# Patient Record
Sex: Female | Born: 1990 | Race: White | Hispanic: No | Marital: Married | Smoking: Never smoker
Health system: Southern US, Community
[De-identification: ages and names within clinical notes are randomized; demographics above are authoritative.]

## PROBLEM LIST (undated history)

## (undated) ENCOUNTER — Inpatient Hospital Stay: Payer: Self-pay

## (undated) ENCOUNTER — Inpatient Hospital Stay (HOSPITAL_COMMUNITY): Payer: Self-pay

## (undated) DIAGNOSIS — G43909 Migraine, unspecified, not intractable, without status migrainosus: Secondary | ICD-10-CM

## (undated) DIAGNOSIS — F32A Depression, unspecified: Secondary | ICD-10-CM

## (undated) DIAGNOSIS — F419 Anxiety disorder, unspecified: Secondary | ICD-10-CM

## (undated) DIAGNOSIS — F329 Major depressive disorder, single episode, unspecified: Secondary | ICD-10-CM

## (undated) HISTORY — PX: WISDOM TOOTH EXTRACTION: SHX21

## (undated) HISTORY — PX: APPENDECTOMY: SHX54

## (undated) HISTORY — PX: OTHER SURGICAL HISTORY: SHX169

## (undated) HISTORY — DX: Major depressive disorder, single episode, unspecified: F32.9

## (undated) HISTORY — DX: Depression, unspecified: F32.A

## (undated) HISTORY — DX: Migraine, unspecified, not intractable, without status migrainosus: G43.909

## (undated) HISTORY — DX: Anxiety disorder, unspecified: F41.9

---

## 2017-11-29 DIAGNOSIS — F419 Anxiety disorder, unspecified: Secondary | ICD-10-CM | POA: Insufficient documentation

## 2018-01-30 ENCOUNTER — Encounter: Payer: Self-pay | Admitting: *Deleted

## 2018-02-01 ENCOUNTER — Encounter: Payer: Self-pay | Admitting: Family Medicine

## 2018-02-01 ENCOUNTER — Ambulatory Visit (INDEPENDENT_AMBULATORY_CARE_PROVIDER_SITE_OTHER): Admitting: Family Medicine

## 2018-02-01 ENCOUNTER — Other Ambulatory Visit: Payer: Self-pay

## 2018-02-01 VITALS — BP 104/62 | HR 72 | Temp 98.2°F | Resp 14 | Ht 60.63 in | Wt 97.5 lb

## 2018-02-01 DIAGNOSIS — Z7689 Persons encountering health services in other specified circumstances: Secondary | ICD-10-CM | POA: Diagnosis not present

## 2018-02-01 DIAGNOSIS — R1032 Left lower quadrant pain: Secondary | ICD-10-CM | POA: Diagnosis not present

## 2018-02-01 DIAGNOSIS — Z3201 Encounter for pregnancy test, result positive: Secondary | ICD-10-CM | POA: Diagnosis not present

## 2018-02-01 DIAGNOSIS — Z3490 Encounter for supervision of normal pregnancy, unspecified, unspecified trimester: Secondary | ICD-10-CM

## 2018-02-01 DIAGNOSIS — Z3200 Encounter for pregnancy test, result unknown: Secondary | ICD-10-CM | POA: Diagnosis not present

## 2018-02-01 DIAGNOSIS — Z113 Encounter for screening for infections with a predominantly sexual mode of transmission: Secondary | ICD-10-CM

## 2018-02-01 LAB — URINALYSIS, ROUTINE W REFLEX MICROSCOPIC
BILIRUBIN URINE: NEGATIVE
Bacteria, UA: NONE SEEN /HPF
GLUCOSE, UA: NEGATIVE
KETONES UR: NEGATIVE
Leukocytes, UA: NEGATIVE
NITRITE: NEGATIVE
PH: 7 (ref 5.0–8.0)
Protein, ur: NEGATIVE
SPECIFIC GRAVITY, URINE: 1.01 (ref 1.001–1.03)
WBC, UA: NONE SEEN /HPF (ref 0–5)

## 2018-02-01 LAB — MICROSCOPIC MESSAGE

## 2018-02-01 LAB — PREGNANCY, URINE: PREG TEST UR: POSITIVE — AB

## 2018-02-01 NOTE — Patient Instructions (Addendum)
First Trimester of Pregnancy The first trimester of pregnancy is from week 1 until the end of week 13 (months 1 through 3). During this time, your baby will begin to develop inside you. At 6-8 weeks, the eyes and face are formed, and the heartbeat can be seen on ultrasound. At the end of 12 weeks, all the baby's organs are formed. Prenatal care is all the medical care you receive before the birth of your baby. Make sure you get good prenatal care and follow all of your doctor's instructions. Follow these instructions at home: Medicines  Take over-the-counter and prescription medicines only as told by your doctor. Some medicines are safe and some medicines are not safe during pregnancy.  Take a prenatal vitamin that contains at least 600 micrograms (mcg) of folic acid.  If you have trouble pooping (constipation), take medicine that will make your stool soft (stool softener) if your doctor approves. Eating and drinking  Eat regular, healthy meals.  Your doctor will tell you the amount of weight gain that is right for you.  Avoid raw meat and uncooked cheese.  If you feel sick to your stomach (nauseous) or throw up (vomit): ? Eat 4 or 5 small meals a day instead of 3 large meals. ? Try eating a few soda crackers. ? Drink liquids between meals instead of during meals.  To prevent constipation: ? Eat foods that are high in fiber, like fresh fruits and vegetables, whole grains, and beans. ? Drink enough fluids to keep your pee (urine) clear or pale yellow. Activity  Exercise only as told by your doctor. Stop exercising if you have cramps or pain in your lower belly (abdomen) or low back.  Do not exercise if it is too hot, too humid, or if you are in a place of great height (high altitude).  Try to avoid standing for long periods of time. Move your legs often if you must stand in one place for a long time.  Avoid heavy lifting.  Wear low-heeled shoes. Sit and stand up straight.  You  can have sex unless your doctor tells you not to. Relieving pain and discomfort  Wear a good support bra if your breasts are sore.  Take warm water baths (sitz baths) to soothe pain or discomfort caused by hemorrhoids. Use hemorrhoid cream if your doctor says it is okay.  Rest with your legs raised if you have leg cramps or low back pain.  If you have puffy, bulging veins (varicose veins) in your legs: ? Wear support hose or compression stockings as told by your doctor. ? Raise (elevate) your feet for 15 minutes, 3-4 times a day. ? Limit salt in your food. Prenatal care  Schedule your prenatal visits by the twelfth week of pregnancy.  Write down your questions. Take them to your prenatal visits.  Keep all your prenatal visits as told by your doctor. This is important. Safety  Wear your seat belt at all times when driving.  Make a list of emergency phone numbers. The list should include numbers for family, friends, the hospital, and police and fire departments. General instructions  Ask your doctor for a referral to a local prenatal class. Begin classes no later than at the start of month 6 of your pregnancy.  Ask for help if you need counseling or if you need help with nutrition. Your doctor can give you advice or tell you where to go for help.  Do not use hot tubs, steam rooms, or   saunas.  Do not douche or use tampons or scented sanitary pads.  Do not cross your legs for long periods of time.  Avoid all herbs and alcohol. Avoid drugs that are not approved by your doctor.  Do not use any tobacco products, including cigarettes, chewing tobacco, and electronic cigarettes. If you need help quitting, ask your doctor. You may get counseling or other support to help you quit.  Avoid cat litter boxes and soil used by cats. These carry germs that can cause birth defects in the baby and can cause a loss of your baby (miscarriage) or stillbirth.  Visit your dentist. At home, brush  your teeth with a soft toothbrush. Be gentle when you floss. Contact a doctor if:  You are dizzy.  You have mild cramps or pressure in your lower belly.  You have a nagging pain in your belly area.  You continue to feel sick to your stomach, you throw up, or you have watery poop (diarrhea).  You have a bad smelling fluid coming from your vagina.  You have pain when you pee (urinate).  You have increased puffiness (swelling) in your face, hands, legs, or ankles. Get help right away if:  You have a fever.  You are leaking fluid from your vagina.  You have spotting or bleeding from your vagina.  You have very bad belly cramping or pain.  You gain or lose weight rapidly.  You throw up blood. It may look like coffee grounds.  You are around people who have German measles, fifth disease, or chickenpox.  You have a very bad headache.  You have shortness of breath.  You have any kind of trauma, such as from a fall or a car accident. Summary  The first trimester of pregnancy is from week 1 until the end of week 13 (months 1 through 3).  To take care of yourself and your unborn baby, you will need to eat healthy meals, take medicines only if your doctor tells you to do so, and do activities that are safe for you and your baby.  Keep all follow-up visits as told by your doctor. This is important as your doctor will have to ensure that your baby is healthy and growing well. This information is not intended to replace advice given to you by your health care provider. Make sure you discuss any questions you have with your health care provider. Document Released: 10/27/2007 Document Revised: 05/18/2016 Document Reviewed: 05/18/2016 Elsevier Interactive Patient Education  2017 [employer]vier Inc.  

## 2018-02-01 NOTE — Progress Notes (Signed)
Patient ID: Vickie Manning, female    DOB: 10-11-1990, 27 y.o.   MRN: [id number]  PCP: Delsa Grana, PA-C  Chief Complaint  Patient presents with  . New Patient    is fasting  . Pregnancy Test    confirmation of home pregnancy test    Subjective:   Vickie Manning is a 27 y.o. female, presents to clinic with CC of suspected pregnancy from + home pregnancy tests.  She has been trying to conceive with her husband for almost a year.  Is here for confirmation of pregnancy and would need OBGYN referral. Notes last LMP 12/10/17, per her norm.  No spotting since.  She has been nauseaous a few times in the past month, no vomiting.  She is low weight ever since a surgery when she was in high school, but since July has maintained her weight.   She is G99P0, married, one female partner for several years.  He has hx of genital herpes, she has tested negative and they are careful to avoid coitus when he has active lesions.  She has not had any past concerns for or + STD's.  Last PAP normal Nov or Dec last year.  No past OBGYN issues.  Sister has hx of endometriosis.  Mother had hysterectomy in her 58's due to bladder prolapse.  She is also here to establish care.    PMHx reviewed includes mild anxiety, not currently treated with medication.  Also Hx of appendectomy 9th grade in 2007, was ruptured, she notes complications with surgery and recovery with "dead guts" she was in hospital and on liquids for several months - Hospitalization in [place].  She started to have     Order [place]  PAP last Nov/Dec - done in [facility] [place] records all on base and for anxiety They did screening for STD's,   She does report mild to moderate intermittent LLQ pain that feels sharp, lasts for only a few seconds at a time, onset ~ 1 week ago.  Bowels normal, denies urinary and vag sx. No back pain.  No vaginal spotting.  09/09/2018   There are no active problems  to display for this patient.   Prior to Admission medications   Medication Sig Start Date End Date Taking? Authorizing Provider  Prenatal Vit-Fe Fumarate-FA (PRENATAL MULTIVITAMIN) TABS tablet Take 2 tablets by mouth daily at 12 noon.   Yes [provider]     No Known Allergies   History reviewed. No pertinent family history.   Social History   Socioeconomic History  . Marital status: Married    Spouse name: Not on file  . Number of children: Not on file  . Years of education: Not on file  . Highest education level: Some college, no degree  Occupational History  . Not on file  Social Needs  . Financial resource strain: Not hard at all  . Food insecurity:    Worry: Never true    Inability: Never true  . Transportation needs:    Medical: No    Non-medical: No  Tobacco Use  . Smoking status: Never Smoker  . Smokeless tobacco: Never Used  Substance and Sexual Activity  . Alcohol use: Never    Frequency: Never  . Drug use: Never  . Sexual activity: Yes  Lifestyle  . Physical activity:    Days per week: 0 days    Minutes per session: 0 min  . Stress: To some  extent  Relationships  . Social connections:    Talks on phone: More than three times a week    Gets together: Once a week    Attends religious service: More than 4 times per year    Active member of club or organization: No    Attends meetings of clubs or organizations: Never    Relationship status: Married  . Intimate partner violence:    Fear of current or ex partner: No    Emotionally abused: No    Physically abused: No    Forced sexual activity: No  Other Topics Concern  . Not on file  Social History Narrative  . Not on file     Review of Systems  Constitutional: Negative.  Negative for activity change, appetite change, chills, diaphoresis, fatigue and fever.  HENT: Negative.   Eyes: Negative.   Respiratory: Negative.   Cardiovascular: Negative.   Gastrointestinal: Positive for  nausea. Negative for abdominal distention, anal bleeding, blood in stool, diarrhea, rectal pain and vomiting.  Endocrine: Negative.   Genitourinary: Positive for pelvic pain. Negative for decreased urine volume, difficulty urinating, dyspareunia, dysuria, enuresis, flank pain, frequency, genital sores, hematuria, menstrual problem, urgency, vaginal bleeding, vaginal discharge and vaginal pain.  Musculoskeletal: Negative.   Skin: Negative.   Allergic/Immunologic: Negative.  Negative for immunocompromised state.  Neurological: Negative.  Negative for syncope and weakness.  Hematological: Negative.   Psychiatric/Behavioral: Negative.   All other systems reviewed and are negative.      Objective:    Vitals:   02/01/18 0912  BP: 104/62  Pulse: 72  Resp: 14  Temp: 98.2 F (36.8 C)  TempSrc: Oral  SpO2: 98%  Weight: 97 lb 8 oz (44.2 kg)  Height: 5' 0.63" (1.54 m)      Physical Exam  Constitutional: She is oriented to person, place, and time. Vital signs are normal. She appears well-developed.  Non-toxic appearance. She does not have a sickly appearance. She does not appear ill. No distress.  Very thin young female, mildly anxious appearing, no distress  HENT:  Head: Normocephalic and atraumatic.  Right Ear: External ear normal.  Left Ear: External ear normal.  Nose: Nose normal.  Mouth/Throat: Uvula is midline, oropharynx is clear and moist and mucous membranes are normal.  Eyes: Pupils are equal, round, and reactive to light. Conjunctivae, EOM and lids are normal.  Neck: Normal range of motion and phonation normal. Neck supple. No tracheal deviation present.  Cardiovascular: Normal rate, regular rhythm, normal heart sounds and normal pulses. Exam reveals no gallop and no friction rub.  No murmur heard. Pulses:      Radial pulses are 2+ on the right side, and 2+ on the left side.       Posterior tibial pulses are 2+ on the right side, and 2+ on the left side.  Pulmonary/Chest:  Effort normal and breath sounds normal. No stridor. No respiratory distress. She has no wheezes. She has no rhonchi. She has no rales. She exhibits no tenderness.  Abdominal: Soft. Normal appearance and bowel sounds are normal. She exhibits no distension. There is tenderness in the left lower quadrant. There is no rigidity, no rebound, no guarding, no CVA tenderness, no tenderness at McBurney's point and negative Murphy's sign.  Musculoskeletal: Normal range of motion. She exhibits no edema or deformity.  Lymphadenopathy:    She has no cervical adenopathy.  Neurological: She is alert and oriented to person, place, and time. She exhibits normal muscle tone. Gait normal.  Skin: Skin is warm, dry and intact. Capillary refill takes less than 2 seconds. No rash noted. She is not diaphoretic. No pallor.  Psychiatric: She has a normal mood and affect. Her speech is normal and behavior is normal.  Nursing note and vitals reviewed.    Results for orders placed or performed in visit on 02/01/18  Pregnancy, urine  Result Value Ref Range   Preg Test, Ur POSITIVE (A) NEGATIVE  Urinalysis, Routine w reflex microscopic  Result Value Ref Range   Color, Urine YELLOW YELLOW   APPearance CLEAR CLEAR   Specific Gravity, Urine 1.010 1.001 - 1.03   pH 7.0 5.0 - 8.0   Glucose, UA NEGATIVE NEGATIVE   Bilirubin Urine NEGATIVE NEGATIVE   Ketones, ur NEGATIVE NEGATIVE   Hgb urine dipstick TRACE (A) NEGATIVE   Protein, ur NEGATIVE NEGATIVE   Nitrite NEGATIVE NEGATIVE   Leukocytes, UA NEGATIVE NEGATIVE   WBC, UA NONE SEEN 0 - 5 /HPF   RBC / HPF 0-2 0 - 2 /HPF   Squamous Epithelial / LPF 0-5 < OR = 5 /HPF   Bacteria, UA NONE SEEN NONE SEEN /HPF  Microscopic Message  Result Value Ref Range   Note         Assessment & Plan:      ICD-10-CM   1. Left lower quadrant pain R10.32 US PELVIC COMPLETE WITH TRANSVAGINAL  2. Pregnancy, unspecified gestational age Z29.90 Urinalysis, Routine w reflex microscopic     Urine Culture    Ambulatory referral to Obstetrics / Gynecology    US PELVIC COMPLETE WITH TRANSVAGINAL  3. Positive urine pregnancy test Z32.01 Ambulatory referral to Obstetrics / Gynecology    US PELVIC COMPLETE WITH TRANSVAGINAL  4. Encounter for confirmation of pregnancy test result with physical examination Z32.00 Pregnancy, urine    Ambulatory referral to Obstetrics / Gynecology  5. Encounter to establish care with new doctor Z76.89 CBC with Differential/Platelet    COMPLETE METABOLIC PANEL WITH GFR  6. Screening for STD (sexually transmitted disease) Z11.3 RPR    HIV antibody    24825 for the transvaginal and 00370 - order changed per radiology EDD 09/08/17   + preg test, per LMP pt estimated 7w 4d, mild LLQ ttp on exam, no rebound tenderness, no guarding, she does have some hx of constipation/IBS, will get pelvis US to confirm IUP and r/o ectopic.  No vag bleeding.    Referral to OBGYN done.  Pt on prenatals.  Discussed importance of maintaining weight to best ability, avoid NSAIDs, 1st trimester pregnancy info printed for pt.    Other pt med records will be requested. Blood STD's done today - refer PAP, pelvic and other STD testing for initial OBGYN visit for routine pregnancy   Delsa Grana, PA-C 02/01/18 9:24 AM

## 2018-02-02 LAB — CBC WITH DIFFERENTIAL/PLATELET
Basophils Absolute: 38 cells/uL (ref 0–200)
Basophils Relative: 0.6 %
EOS ABS: 13 {cells}/uL — AB (ref 15–500)
Eosinophils Relative: 0.2 %
HCT: 42.6 % (ref 35.0–45.0)
HEMOGLOBIN: 14.6 g/dL (ref 11.7–15.5)
Lymphs Abs: 1517 cells/uL (ref 850–3900)
MCH: 31.1 pg (ref 27.0–33.0)
MCHC: 34.3 g/dL (ref 32.0–36.0)
MCV: 90.8 fL (ref 80.0–100.0)
MPV: 9.5 fL (ref 7.5–12.5)
Monocytes Relative: 8.3 %
NEUTROS ABS: 4301 {cells}/uL (ref 1500–7800)
Neutrophils Relative %: 67.2 %
PLATELETS: 234 10*3/uL (ref 140–400)
RBC: 4.69 10*6/uL (ref 3.80–5.10)
RDW: 11.7 % (ref 11.0–15.0)
TOTAL LYMPHOCYTE: 23.7 %
WBC: 6.4 10*3/uL (ref 3.8–10.8)
WBCMIX: 531 {cells}/uL (ref 200–950)

## 2018-02-02 LAB — COMPLETE METABOLIC PANEL WITH GFR
AG RATIO: 2 (calc) (ref 1.0–2.5)
ALBUMIN MSPROF: 4.7 g/dL (ref 3.6–5.1)
ALT: 9 U/L (ref 6–29)
AST: 15 U/L (ref 10–30)
Alkaline phosphatase (APISO): 41 U/L (ref 33–115)
BUN/Creatinine Ratio: 9 (calc) (ref 6–22)
BUN: 6 mg/dL — AB (ref 7–25)
CALCIUM: 10.1 mg/dL (ref 8.6–10.2)
CHLORIDE: 102 mmol/L (ref 98–110)
CO2: 26 mmol/L (ref 20–32)
CREATININE: 0.69 mg/dL (ref 0.50–1.10)
GFR, Est African American: 139 mL/min/{1.73_m2} (ref >=60)
GFR, Est Non African American: 120 mL/min/{1.73_m2} (ref >=60)
GLUCOSE: 81 mg/dL (ref 65–99)
Globulin: 2.3 g/dL (calc) (ref 1.9–3.7)
POTASSIUM: 4.5 mmol/L (ref 3.5–5.3)
SODIUM: 137 mmol/L (ref 135–146)
Total Bilirubin: 0.5 mg/dL (ref 0.2–1.2)
Total Protein: 7 g/dL (ref 6.1–8.1)

## 2018-02-02 LAB — URINE CULTURE
MICRO NUMBER: 91088088
RESULT: NO GROWTH
SPECIMEN QUALITY:: ADEQUATE

## 2018-02-02 LAB — HIV ANTIBODY (ROUTINE TESTING W REFLEX): HIV 1&2 Ab, 4th Generation: NONREACTIVE

## 2018-02-02 LAB — RPR: RPR: NONREACTIVE

## 2018-02-03 ENCOUNTER — Ambulatory Visit (HOSPITAL_COMMUNITY)
Admission: RE | Admit: 2018-02-03 | Discharge: 2018-02-03 | Disposition: A | Discharge status: Home / Self Care | Source: Ambulatory Visit | Attending: Family Medicine | Admitting: Family Medicine

## 2018-02-03 ENCOUNTER — Encounter: Payer: Self-pay | Admitting: Family Medicine

## 2018-02-03 ENCOUNTER — Telehealth: Payer: Self-pay | Admitting: *Deleted

## 2018-02-03 ENCOUNTER — Other Ambulatory Visit: Payer: Self-pay | Admitting: Family Medicine

## 2018-02-03 DIAGNOSIS — R1032 Left lower quadrant pain: Secondary | ICD-10-CM

## 2018-02-03 DIAGNOSIS — Z3490 Encounter for supervision of normal pregnancy, unspecified, unspecified trimester: Secondary | ICD-10-CM

## 2018-02-03 DIAGNOSIS — O26891 Other specified pregnancy related conditions, first trimester: Secondary | ICD-10-CM | POA: Diagnosis not present

## 2018-02-03 DIAGNOSIS — Z3A01 Less than 8 weeks gestation of pregnancy: Secondary | ICD-10-CM | POA: Diagnosis not present

## 2018-02-03 DIAGNOSIS — Z3201 Encounter for pregnancy test, result positive: Secondary | ICD-10-CM

## 2018-02-03 NOTE — Telephone Encounter (Signed)
Pt was never told that she would be given measurements or see heartbeat etc.   Discussed with pt the ultrasound was ordered because of + pregnancy test and complaint of LLQ pain, that I wanted to confirm an IUP and rule out ectopic.  We only discussed how early [place] measurements of crown-rump length give accurate EDD.   PT has OB appt next Friday and other imaging will be determined by her OBGYN.    [place] results currently confirm IUP, but do not confirm viability and I did discuss this with pt who will come in for quantitative HCG next week x 2 to further assess.

## 2018-02-03 NOTE — Progress Notes (Signed)
Received report from radiology for US OB < 14 w  Dr. Kris Hartmann reading radiologist  Single early intrauterine gestational sac  Measuring approximately 5 w by mean sac diameter Consider following beta HCG levels with follow up US to assess viability in 14d   Radiology staff noted that pt was extremely anxious, was hoping to see everything on [place], see the heart beat.    Pt called 3:59 PM   Pt extremely anxious, she has appt with OBGYN and she wants to know what else she can do

## 2018-02-03 NOTE — Telephone Encounter (Signed)
Received call from [facility].   Reports that patient is scheduled for OB US to confirm pregnancy. States that patient was advised tht she would be given measurements, pictures and be able to see the HR of baby while [place] is being performed. Korea tech states that this is not done at [facility] as all scans need to be read by radiologist prior to results being given.   Patient agrees to continue to with [place] today to make sure that baby is healthy, but would like to be [facility] with [facility] have [place] where she can see the baby.   Please advise.

## 2018-02-06 ENCOUNTER — Other Ambulatory Visit

## 2018-02-06 DIAGNOSIS — Z3490 Encounter for supervision of normal pregnancy, unspecified, unspecified trimester: Secondary | ICD-10-CM

## 2018-02-07 LAB — HCG, QUANTITATIVE, PREGNANCY: HCG, Total, QN: 3905 m[IU]/mL

## 2018-02-07 NOTE — Progress Notes (Signed)
Quant HCG resulted from 02/06/18 (see below) Serial Quant HCG ordered 48 - 72 hours after initial  HCG, Total, QN  hCG, quantitative, pregnancy  Collected: 02/06/18 0802  Resulting lab: [facility]  Reference range: mIU/mL  Value: 3,905  Comment: Verified by repeat analysis.  .  Gestational Age  Expected hCG values (mIU/mL)  <1 Week:         5-50  1-2 Weeks:        50-500  2-3 Weeks:        [phone]  3-4 Weeks:        500-10000  4-5 Weeks:        1000-50000  5-6 Weeks:        10000-100000  6-8 Weeks:        15000-200000  2-3 Months:       10000-100000  The table above provides only a very rough estimate of  gestational age and should be used only in conjunction  with other methods for establishing gestational age.  Much more reliable and accurate estimations of gestational  age may be obtained by using LMP or ultrasound.  .  .  Values from different assay methods may vary.  The use of this assay to monitor or to diagnose  patients with cancer or any condition unrelated  to pregnancy has not been cleared or approved by  the FDA or the manufacturer of the assay.    Following to assess viability of IUP.  Going to OBGYN this week.

## 2018-02-07 NOTE — Addendum Note (Signed)
Addended by: Delsa Grana on: 02/07/2018 10:00 AM   Modules accepted: Orders

## 2018-02-08 ENCOUNTER — Other Ambulatory Visit

## 2018-02-08 DIAGNOSIS — Z3490 Encounter for supervision of normal pregnancy, unspecified, unspecified trimester: Secondary | ICD-10-CM

## 2018-02-09 ENCOUNTER — Encounter: Payer: Self-pay | Admitting: *Deleted

## 2018-02-09 LAB — HCG, QUANTITATIVE, PREGNANCY: HCG, TOTAL, QN: 8695 m[IU]/mL

## 2018-02-10 ENCOUNTER — Ambulatory Visit (INDEPENDENT_AMBULATORY_CARE_PROVIDER_SITE_OTHER): Admitting: Obstetrics and Gynecology

## 2018-02-10 ENCOUNTER — Other Ambulatory Visit: Payer: Self-pay | Admitting: Obstetrics and Gynecology

## 2018-02-10 ENCOUNTER — Telehealth: Payer: Self-pay | Admitting: Obstetrics and Gynecology

## 2018-02-10 ENCOUNTER — Other Ambulatory Visit: Payer: Self-pay

## 2018-02-10 ENCOUNTER — Encounter: Payer: Self-pay | Admitting: Obstetrics and Gynecology

## 2018-02-10 VITALS — BP 102/76 | HR 75 | Ht 66.0 in | Wt 100.0 lb

## 2018-02-10 DIAGNOSIS — Z3A08 8 weeks gestation of pregnancy: Secondary | ICD-10-CM | POA: Diagnosis not present

## 2018-02-10 DIAGNOSIS — O3680X0 Pregnancy with inconclusive fetal viability, not applicable or unspecified: Secondary | ICD-10-CM | POA: Diagnosis not present

## 2018-02-10 MED ORDER — SERTRALINE HCL 50 MG PO TABS: 50.0000 mg | ORAL_TABLET | Freq: Every day | ORAL | 2 refills | Status: DC

## 2018-02-10 MED ORDER — CONCEPT OB 130-92.4-1 MG PO CAPS: 1.0000 | ORAL_CAPSULE | Freq: Every day | ORAL | 11 refills | Status: DC

## 2018-02-10 MED ORDER — PRENATAL MULTIVITAMIN CH: 1.0000 | ORAL_TABLET | Freq: Every day | ORAL | 3 refills | Status: DC

## 2018-02-10 NOTE — Progress Notes (Signed)
NOB Referred by [employer]

## 2018-02-10 NOTE — Telephone Encounter (Signed)
Zoloft and prenatal at [facility] in [place]

## 2018-02-10 NOTE — Telephone Encounter (Signed)
Please advise 

## 2018-02-10 NOTE — Telephone Encounter (Signed)
Patient's insurance does not cover Rx at [facility] so she is asking if pharmacy could be changed to [place], [address] and asked that med be sent there.

## 2018-02-11 LAB — BETA HCG QUANT (REF LAB): hCG Quant: 17301 m[IU]/mL

## 2018-02-11 LAB — ABO AND RH: RH TYPE: POSITIVE

## 2018-02-11 LAB — PROGESTERONE: PROGESTERONE: 9.8 ng/mL

## 2018-02-13 ENCOUNTER — Other Ambulatory Visit (HOSPITAL_COMMUNITY)
Admission: RE | Admit: 2018-02-13 | Discharge: 2018-02-13 | Disposition: A | Discharge status: Home / Self Care | Source: Ambulatory Visit | Attending: Obstetrics and Gynecology | Admitting: Obstetrics and Gynecology

## 2018-02-13 ENCOUNTER — Other Ambulatory Visit (INDEPENDENT_AMBULATORY_CARE_PROVIDER_SITE_OTHER)

## 2018-02-13 ENCOUNTER — Encounter: Payer: Self-pay | Admitting: Obstetrics and Gynecology

## 2018-02-13 ENCOUNTER — Telehealth: Payer: Self-pay

## 2018-02-13 ENCOUNTER — Other Ambulatory Visit: Payer: Self-pay | Admitting: Obstetrics and Gynecology

## 2018-02-13 ENCOUNTER — Ambulatory Visit (INDEPENDENT_AMBULATORY_CARE_PROVIDER_SITE_OTHER): Admitting: Obstetrics and Gynecology

## 2018-02-13 VITALS — BP 110/68 | Wt 99.0 lb

## 2018-02-13 DIAGNOSIS — Z113 Encounter for screening for infections with a predominantly sexual mode of transmission: Secondary | ICD-10-CM | POA: Insufficient documentation

## 2018-02-13 DIAGNOSIS — O200 Threatened abortion: Secondary | ICD-10-CM

## 2018-02-13 DIAGNOSIS — O209 Hemorrhage in early pregnancy, unspecified: Secondary | ICD-10-CM | POA: Diagnosis not present

## 2018-02-13 DIAGNOSIS — Z3A01 Less than 8 weeks gestation of pregnancy: Secondary | ICD-10-CM

## 2018-02-13 DIAGNOSIS — Z124 Encounter for screening for malignant neoplasm of cervix: Secondary | ICD-10-CM

## 2018-02-13 NOTE — Progress Notes (Signed)
Routine Prenatal Care Visit  Subjective  Vickie Manning is a 28 y.o. G1P0000 at [redacted]w[redacted]d being seen today for ongoing prenatal care.  She is currently monitored for the following issues for this low-risk pregnancy and has Vaginal bleeding in pregnancy, first trimester on their problem list.  ----------------------------------------------------------------------------------- Patient reports recent-onset cramping and vaginal spotting.  The amount has been small overall.  However, she is concerned about miscarriage.     . Vag. Bleeding: Small.   . Denies leaking of fluid.  ----------------------------------------------------------------------------------- The following portions of the patient's history were reviewed and updated as appropriate: allergies, current medications, past family history, past medical history, past social history, past surgical history and problem list. Problem list updated.   Objective  Blood pressure 110/68, weight 99 lb (44.9 kg), last menstrual period 12/17/2017. Pregravid weight 105 lb (47.6 kg) Total Weight Gain -6 lb (-2.722 kg) Urinalysis: Urine Protein    Urine Glucose    Fetal Status: Fetal Heart Rate (bpm): 116         General:  Alert, oriented and cooperative. Patient is in no acute distress.  Skin: Skin is warm and dry. No rash noted.   Cardiovascular: Normal heart rate noted  Respiratory: Normal respiratory effort, no problems with respiration noted  Abdomen: Soft, gravid, appropriate for gestational age.       Pelvic:  Cervical exam performed      Cervix appears closed, no active bleeding from cervical os.  Extremities: Normal range of motion.     Mental Status: Normal mood and affect. Normal behavior. Normal judgment and thought content.   US Ob Transvaginal  Result Date: 02/13/2018 Patient Name: Vickie Manning DOB: 1990/08/14 MRN: [id number] ULTRASOUND REPORT Location: [facility] Date of Service: 02/13/2018 Indications:Threatened abortion Findings:  [name] intrauterine pregnancy is visualized with a CRL consistent with [redacted]w[redacted]d gestation, giving an (U/S) EDD of 10/08/18. The (U/S) EDD is not consistent with the clinically established EDD of 09/23/18. FHR: 116bpm CRL measurement: 4.8 mm Yolk sac is visualized and appears normal and early anatomy is normal. Amnion: visualized and appears normal Right Ovary is normal in appearance. Left Ovary is normal appearance. Corpus luteal cyst:  is not visualized Survey of the adnexa demonstrates no adnexal masses. There is no free peritoneal fluid in the cul de sac. Impression: 1. [redacted]w[redacted]d Viable Singleton Intrauterine pregnancy by U/S. 2. (U/S) EDD is  NOT consistent with Clinically established EDD of 09/23/18 3 NO subchorionic hemorrhage is seen. Abeer Alsammarraie, RDMS There is a viable singleton gestation.  Detailed evaluation of the fetal anatomy is precluded by early gestational age.  It must be noted that a normal ultrasound particular at this early gestational age is unable to rule out fetal aneuploidy, risk of first trimester miscarriage, or anatomic birth defects. Prentice Docker, MD, [facility], [facility] 02/13/2018 5:22 PM    Assessment   27 y.o. G1P0000 at [redacted]w[redacted]d by  10/08/2018, by Ultrasound presenting for work-in prenatal visit  Plan   Pregnancy #1 Problems (from 12/17/17 to present)    Problem Noted Resolved   Vaginal bleeding in pregnancy, first trimester 02/13/2018 by Will Bonnet, MD No       Preterm labor symptoms and general obstetric precautions including but not limited to vaginal bleeding, contractions, leaking of fluid and fetal movement were reviewed in detail with the patient. Please refer to After Visit Summary for other counseling recommendations.   - patient reassured about findings today.   - discussed risk of bleeding in  first trimester. Patient is rh positive - pap smear and aptima obtained today  Recommend keep already-scheduled follow up.   Prentice Docker, MD, [facility], [facility] 02/16/2018 8:05 PM

## 2018-02-13 NOTE — Progress Notes (Signed)
Obstetric Problem Visit    Chief Complaint:  Chief Complaint  Patient presents with  . NOB    Referred by [place]    History of Present Illness: Patient is a 27 y.o. G1P0000 [redacted]w[redacted]d presenting for first trimester cramping in the setting of inconclusive fetal viability.  Ultrasound 02/03/2018 revealed a gestational sac, no yolk sac or fetal pole.  She has not had any bleeding.    Any recent trauma:  No Recent intercourse:  No History of prior miscarriage:  No Prior ultrasound demonstrating IUP: ultrasound showing gestational sac with MSD of 69mm on 02/03/18.  Patient's last menstrual period was 12/17/2017 (exact date). Prior ultrasound demonstrating viable IUP:  No Prior Serum HCG:  Yes Rh status: 3,905 mIU/mL on 02/06/18 with appropriate rise to 8,679mIU/mL on 02/08/18  She does have a significant history of depression and anxiety.  Stopped all medication with positive UPT.  Review of Systems: Review of Systems  Constitutional: Negative.   Gastrointestinal: Positive for abdominal pain and nausea. Negative for heartburn and vomiting.  Skin: Negative.   Psychiatric/Behavioral: Negative for depression, hallucinations, memory loss, substance abuse and suicidal ideas. The patient is nervous/anxious. The patient does not have insomnia.     Past Medical History:  Past Medical History:  Diagnosis Date  . Anxiety   . Depression     Past Surgical History:  Past Surgical History:  Procedure Laterality Date  . APPENDECTOMY    . WISDOM TOOTH EXTRACTION      Obstetric History: G1P0000  Family History:  Family History  Problem Relation Age of Onset  . Endometriosis Sister     Social History:  Social History   Socioeconomic History  . Marital status: Married    Spouse name: Not on file  . Number of children: Not on file  . Years of education: Not on file  . Highest education level: Some college, no degree  Occupational History  . Not on file  Social Needs  . Financial  resource strain: Not hard at all  . Food insecurity:    Worry: Never true    Inability: Never true  . Transportation needs:    Medical: No    Non-medical: No  Tobacco Use  . Smoking status: Never Smoker  . Smokeless tobacco: Never Used  Substance and Sexual Activity  . Alcohol use: Never    Frequency: Never  . Drug use: Never  . Sexual activity: Yes  Lifestyle  . Physical activity:    Days per week: 0 days    Minutes per session: 0 min  . Stress: To some extent  Relationships  . Social connections:    Talks on phone: More than three times a week    Gets together: Once a week    Attends religious service: More than 4 times per year    Active member of club or organization: No    Attends meetings of clubs or organizations: Never    Relationship status: Married  . Intimate partner violence:    Fear of current or ex partner: No    Emotionally abused: No    Physically abused: No    Forced sexual activity: No  Other Topics Concern  . Not on file  Social History Narrative  . Not on file    Allergies:  No Known Allergies  Medications: Prior to Admission medications   Medication Sig Start Date End Date Taking? Authorizing Provider  Prenat w/o A Vit-FeFum-FePo-FA (CONCEPT OB) 130-92.4-1 MG CAPS Take 1  capsule by mouth daily. 02/10/18   [name], MD  sertraline (ZOLOFT) 50 MG tablet Take 1 tablet (50 mg total) by mouth daily. 02/10/18   [name], MD    Physical Exam Vitals: Blood pressure 102/76, pulse 75, height 5\' 6"  (1.676 m), weight 100 lb (45.4 kg), last menstrual period 12/17/2017. General: NAD HEENT: normocephalic, anicteric Pulmonary: No increased work of breathing, Genitourinary:  External: Normal external female genitalia.  Normal urethral meatus, normal Bartholin's and Skene's glands.    Vagina: Normal vaginal mucosa, no evidence of prolapse.    Cervix: closed  Uterus: non-enlarged  Adnexa: ovaries non-enlarged, no adnexal masses  Rectal:  deferred Extremities: no edema, erythema, or tenderness Neurologic: Grossly intact Psychiatric: mood appropriate, affect full  Bedside TVUS: Female CMA chaperone present.  Intrauterine gestational sac with yolk sac possible small early fetal pole developing but to early to definitively call or measure  Assessment: 27 y.o. G1P0000 [redacted]w[redacted]d presenting for evaluation of first trimester vaginal bleeding  Plan: Problem List Items Addressed This Visit    None    Visit Diagnoses    Pregnancy with inconclusive fetal viability, single or unspecified fetus    -  Primary   Relevant Orders   Beta hCG quant (ref lab)   Beta hCG quant (ref lab) (Completed)   Progesterone (Completed)   ABO and RH (Completed)   Type and screen   US PELVIS TRANSVANGINAL NON-OB (TV ONLY)      1) Pregnancy of unknown fetal viability - based on HCG trend and further development of internal structure based on initial ultrasound favor viable early IUP.  Will obtain repeat HCG and ultrasound 14 days from initial scan.  2) Repeat TVUS evaluation to document viability in 14 days from 02/03/18.  "Society of Radiology[employer] in Ultrasound Guidelines for Transvaginal Ultrasonographic Diagnosis of Early Pregnancy Loss" and adopted in [place] Number 150, May 2015 (reaffirmed 2017) "Early Pregnancy Loss"  - Absence of embryo with a discernable heartbeat 2 week after initial ultrasound showing a gestational sac without a yolk sac  3) The patient is unknown Rh status, T&S obtained  4) Routine bleeding precautions were discussed with the patient prior the conclusion of today's visit.  5) Anxiety / Depression - Rx zoloft 50mg  daily  6) States last pap November 2018 will obtain records  7) Return in about 6 days (around 02/16/2018) for TVUS in 6 days 9/26, repeat labs tuesday 9/24.     [name], MD, [facility], [facility] 02/13/2018, 5:12 PM

## 2018-02-13 NOTE — Telephone Encounter (Signed)
Pt called the after hour nurse 02/11/18 at 6:33pm c/o spotting and cramping.  After wiping she saw a pink color on the toilet paper and a few drops n the toilet as well.  The cramping is 3/10.  [phone]  Called pt this am.  She states she had a little bit of spotting yesterday and none since.

## 2018-02-14 ENCOUNTER — Other Ambulatory Visit

## 2018-02-16 ENCOUNTER — Ambulatory Visit (INDEPENDENT_AMBULATORY_CARE_PROVIDER_SITE_OTHER)

## 2018-02-16 ENCOUNTER — Ambulatory Visit (INDEPENDENT_AMBULATORY_CARE_PROVIDER_SITE_OTHER): Admitting: Obstetrics and Gynecology

## 2018-02-16 ENCOUNTER — Encounter: Payer: Self-pay | Admitting: Obstetrics and Gynecology

## 2018-02-16 VITALS — BP 102/66 | Wt 99.0 lb

## 2018-02-16 DIAGNOSIS — O3411 Maternal care for benign tumor of corpus uteri, first trimester: Secondary | ICD-10-CM

## 2018-02-16 DIAGNOSIS — N8311 Corpus luteum cyst of right ovary: Secondary | ICD-10-CM | POA: Diagnosis not present

## 2018-02-16 DIAGNOSIS — Z3A01 Less than 8 weeks gestation of pregnancy: Secondary | ICD-10-CM | POA: Diagnosis not present

## 2018-02-16 DIAGNOSIS — Z3401 Encounter for supervision of normal first pregnancy, first trimester: Secondary | ICD-10-CM

## 2018-02-16 DIAGNOSIS — O3680X0 Pregnancy with inconclusive fetal viability, not applicable or unspecified: Secondary | ICD-10-CM | POA: Diagnosis not present

## 2018-02-16 DIAGNOSIS — Z3400 Encounter for supervision of normal first pregnancy, unspecified trimester: Secondary | ICD-10-CM

## 2018-02-16 NOTE — Progress Notes (Signed)
ROB Ultrasound today 

## 2018-02-18 NOTE — Progress Notes (Signed)
New Obstetric Patient H&P    Chief Complaint: "Desires prenatal care"   History of Present Illness: Patient is a 27 y.o. G1P0000 Not Hispanic or Latino female, presents with amenorrhea and positive home pregnancy test. Patient's last menstrual period was 12/17/2017 (exact date). and based on her  LMP, her EDD is Estimated Date of Delivery: 10/08/18 and her EGA is [redacted]w[redacted]d. Cycles are regular monthly. Her last pap smear was 1 years ago and was no abnormalities per patient report.    She had a urine pregnancy test which was positive 2 week(s)  ago. Since her LMP she claims she has experienced poor appetite, fatigue, breast tenderness. She denies vaginal bleeding. Her past medical history is contibutory for anxiety and depression.   Since her LMP, she admits to the use of tobacco products  no She claims she has gained   no pounds since the start of her pregnancy.  There are cats in the home in the home  no I She admits close contact with children on a regular basis  no  She has had chicken pox in the past yes She has had Tuberculosis exposures, symptoms, or previously tested positive for TB   no Current or past history of domestic violence. no  Genetic Screening/Teratology Counseling: (Includes patient, baby's father, or anyone in either family with:)   52. Patient's age >/= 30 at [facility]  no 2. Thalassemia ([place], [place], [place], or Asian background): MCV<80  no 3. Neural tube defect (meningomyelocele, spina bifida, anencephaly)  no 4. Congenital heart defect  no  5. Down syndrome  no 6. Tay-Sachs (Jewish, [place])  no 7. Canavan's Disease  no 8. Sickle cell disease or trait (African)  no  9. Hemophilia or other blood disorders  no  10. Muscular dystrophy  no  11. Cystic fibrosis  no  12. Huntington's Chorea  no  13. Mental retardation/autism  no 14. Other inherited genetic or chromosomal disorder  no 15. Maternal metabolic disorder (DM, PKU, etc)  no 16. Patient or FOB  with a child with a birth defect not listed above no  16a. Patient or FOB with a birth defect themselves no 17. Recurrent pregnancy loss, or stillbirth  no  18. Any medications since LMP other than prenatal vitamins (include vitamins, supplements, OTC meds, drugs, alcohol)  no 19. Any other genetic/environmental exposure to discuss  no  Infection History:   1. Lives with someone with TB or TB exposed  no  2. Patient or partner has history of genital herpes  Yes 3. Rash or viral illness since LMP  no 4. History of STI (GC, CT, HPV, syphilis, HIV)  no 5. History of recent travel :  no  Other pertinent information:  no     Review of Systems:10 point review of systems negative unless otherwise noted in HPI  Past Medical History:  Past Medical History:  Diagnosis Date  . Anxiety   . Depression     Past Surgical History:  Past Surgical History:  Procedure Laterality Date  . APPENDECTOMY    . WISDOM TOOTH EXTRACTION      Gynecologic History: Patient's last menstrual period was 12/17/2017 (exact date).  Obstetric History: G1P0000  Family History:  Family History  Problem Relation Age of Onset  . Endometriosis Sister     Social History:  Social History   Socioeconomic History  . Marital status: Married    Spouse name: Not on file  . Number of children: Not on file  .  Years of education: Not on file  . Highest education level: Some college, no degree  Occupational History  . Not on file  Social Needs  . Financial resource strain: Not hard at all  . Food insecurity:    Worry: Never true    Inability: Never true  . Transportation needs:    Medical: No    Non-medical: No  Tobacco Use  . Smoking status: Never Smoker  . Smokeless tobacco: Never Used  Substance and Sexual Activity  . Alcohol use: Never    Frequency: Never  . Drug use: Never  . Sexual activity: Yes  Lifestyle  . Physical activity:    Days per week: 0 days    Minutes per session: 0 min  .  Stress: To some extent  Relationships  . Social connections:    Talks on phone: More than three times a week    Gets together: Once a week    Attends religious service: More than 4 times per year    Active member of club or organization: No    Attends meetings of clubs or organizations: Never    Relationship status: Married  . Intimate partner violence:    Fear of current or ex partner: No    Emotionally abused: No    Physically abused: No    Forced sexual activity: No  Other Topics Concern  . Not on file  Social History Narrative  . Not on file    Allergies:  No Known Allergies  Medications: Prior to Admission medications   Medication Sig Start Date End Date Taking? Authorizing Provider  Prenat w/o A Vit-FeFum-FePo-FA (CONCEPT OB) 130-92.4-1 MG CAPS Take 1 capsule by mouth daily. 02/10/18  Yes [name], MD  sertraline (ZOLOFT) 50 MG tablet Take 1 tablet (50 mg total) by mouth daily. 02/10/18  Yes [name], MD    Physical Exam Vitals: Blood pressure 102/66, weight 99 lb (44.9 kg), last menstrual period 12/17/2017.  General: NAD HEENT: normocephalic, anicteric Thyroid: no enlargement, no palpable nodules Pulmonary: No increased work of breathing, CTAB Cardiovascular: RRR, distal pulses 2+ Abdomen: NABS, soft, non-tender, non-distended.  Umbilicus without lesions.  No hepatomegaly, splenomegaly or masses palpable. No evidence of hernia  Genitourinary:  External: Normal external female genitalia.  Normal urethral meatus, normal  Bartholin's and Skene's glands.    Vagina: Normal vaginal mucosa, no evidence of prolapse.    Cervix: Grossly normal in appearance, no bleeding  Uterus: Non-enlarged, mobile, normal contour.  No CMT  Adnexa: ovaries non-enlarged, no adnexal masses  Rectal: deferred Extremities: no edema, erythema, or tenderness Neurologic: Grossly intact Psychiatric: mood appropriate, affect full   Assessment: 27 y.o. G1P0000 at [redacted]w[redacted]d presenting  to initiate prenatal care  Plan: 1) Avoid alcoholic beverages. 2) Patient encouraged not to smoke.  3) Discontinue the use of all non-medicinal drugs and chemicals.  4) Take prenatal vitamins daily.  5) Nutrition, food safety (fish, cheese advisories, and high nitrite foods) and exercise discussed. 6) Hospital and practice style discussed with cross coverage system.  7) Genetic Screening, such as with 1st Trimester Screening, cell free fetal DNA, AFP testing, and Ultrasound, as well as with amniocentesis and CVS as appropriate, is discussed with patient. At the conclusion of today's visit patient requested genetic testing 8) Patient is asked about travel to areas at risk for the Zika virus, and counseled to avoid travel and exposure to mosquitoes or sexual partners who may have themselves been exposed to the virus. Testing is discussed, and  will be ordered as appropriate.   [name], MD, [facility], [facility] 02/18/2018, 7:47 PM

## 2018-02-21 ENCOUNTER — Telehealth: Payer: Self-pay | Admitting: Obstetrics and Gynecology

## 2018-02-21 LAB — CYTOLOGY - PAP
Chlamydia: NEGATIVE
DIAGNOSIS: UNDETERMINED — AB
HPV (WINDOPATH): DETECTED — AB
Neisseria Gonorrhea: NEGATIVE

## 2018-02-21 NOTE — Telephone Encounter (Addendum)
Discussed Pap smear results with patient.  Offered colposcopy either during pregnancy or at 6 weeks postpartum.  She elects colposcopy during pregnancy.  I spent a great deal of time explaining the process that leads to cervical cancer and the meaning of her Pap smear.  We were disconnected at the end of the phone call while discussing some of her questions.  However she had already stated she would call and schedule a colposcopy.  She called back.  Discussed further. She will consider what she would like to do.

## 2018-02-21 NOTE — Telephone Encounter (Signed)
Patient is calling back to speak with Dr. Glennon Mac. Patient would like a call back please!

## 2018-03-03 ENCOUNTER — Ambulatory Visit (INDEPENDENT_AMBULATORY_CARE_PROVIDER_SITE_OTHER): Admitting: Obstetrics and Gynecology

## 2018-03-03 VITALS — BP 100/68 | Wt 100.0 lb

## 2018-03-03 DIAGNOSIS — Z3401 Encounter for supervision of normal first pregnancy, first trimester: Secondary | ICD-10-CM

## 2018-03-03 DIAGNOSIS — Z3A08 8 weeks gestation of pregnancy: Secondary | ICD-10-CM

## 2018-03-03 LAB — POCT URINALYSIS DIPSTICK OB
Glucose, UA: NEGATIVE
PROTEIN: NEGATIVE

## 2018-03-03 LAB — OB RESULTS CONSOLE VARICELLA ZOSTER ANTIBODY, IGG: Varicella: IMMUNE

## 2018-03-03 MED ORDER — DOXYLAMINE-PYRIDOXINE ER 20-20 MG PO TBCR: 1.0000 | EXTENDED_RELEASE_TABLET | Freq: Two times a day (BID) | ORAL | 1 refills | Status: AC

## 2018-03-03 NOTE — Progress Notes (Signed)
    Routine Prenatal Care Visit  Subjective  Vickie Manning is a 27 y.o. G1P0000 at [redacted]w[redacted]d being seen today for ongoing prenatal care.  She is currently monitored for the following issues for this low-risk pregnancy and has Vaginal bleeding in pregnancy, first trimester on their problem list.  ----------------------------------------------------------------------------------- Patient reports no complaints.   Contractions: Not present. Vag. Bleeding: None.  Movement: Absent. Denies leaking of fluid.  ----------------------------------------------------------------------------------- The following portions of the patient's history were reviewed and updated as appropriate: allergies, current medications, past family history, past medical history, past social history, past surgical history and problem list. Problem list updated.   Objective  Blood pressure 100/68, weight 100 lb (45.4 kg), last menstrual period 12/17/2017. Pregravid weight 105 lb (47.6 kg) Total Weight Gain -5 lb (-2.268 kg) Urinalysis:      Fetal Status: Fetal Heart Rate (bpm): 176   Movement: Absent     General:  Alert, oriented and cooperative. Patient is in no acute distress.  Skin: Skin is warm and dry. No rash noted.   Cardiovascular: Normal heart rate noted  Respiratory: Normal respiratory effort, no problems with respiration noted  Abdomen: Soft, gravid, appropriate for gestational age. Pain/Pressure: Absent     Pelvic:  Cervical exam deferred        Extremities: Normal range of motion.     ental Status: Normal mood and affect. Normal behavior. Normal judgment and thought content.     Assessment   27 y.o. G1P0000 at [redacted]w[redacted]d by  10/08/2018, by Ultrasound presenting for routine prenatal visit  Plan   Pregnancy #1 Problems (from 12/17/17 to present)    Problem Noted Resolved   Vaginal bleeding in pregnancy, first trimester 02/13/2018 by Will Bonnet, MD No       Gestational age appropriate obstetric  precautions including but not limited to vaginal bleeding, contractions, leaking of fluid and fetal movement were reviewed in detail with the patient.   - doing well on zoloft - NOB labs today - Rx bonjesta, did well with samples - Maternity 21 next visit  Return in about 2 weeks (around 03/17/2018) for [place].  [name], MD, [facility], [facility] 03/03/2018, 3:00 PM

## 2018-03-03 NOTE — Progress Notes (Signed)
ROB N&V  Migraine x few days Flu vaccine NV per pt

## 2018-03-04 LAB — RPR+RH+ABO+RUB AB+AB SCR+CB...
Antibody Screen: NEGATIVE
HEP B S AG: NEGATIVE
HIV Screen 4th Generation wRfx: NONREACTIVE
Hematocrit: 36.8 % (ref 34.0–46.6)
Hemoglobin: 13.2 g/dL (ref 11.1–15.9)
MCH: 32 pg (ref 26.6–33.0)
MCHC: 35.9 g/dL — AB (ref 31.5–35.7)
MCV: 89 fL (ref 79–97)
PLATELETS: 246 10*3/uL (ref 150–450)
RBC: 4.12 x10E6/uL (ref 3.77–5.28)
RDW: 12.2 % — ABNORMAL LOW (ref 12.3–15.4)
RPR Ser Ql: NONREACTIVE
RUBELLA: 2.01 {index} (ref >0.99)
Rh Factor: POSITIVE
VARICELLA: 1370 {index} (ref >165)
WBC: 8.6 10*3/uL (ref 3.4–10.8)

## 2018-03-05 LAB — URINE CULTURE: ORGANISM ID, BACTERIA: NO GROWTH

## 2018-03-08 ENCOUNTER — Encounter: Payer: Self-pay | Admitting: Obstetrics and Gynecology

## 2018-03-08 DIAGNOSIS — Z3490 Encounter for supervision of normal pregnancy, unspecified, unspecified trimester: Secondary | ICD-10-CM | POA: Insufficient documentation

## 2018-03-17 ENCOUNTER — Ambulatory Visit (INDEPENDENT_AMBULATORY_CARE_PROVIDER_SITE_OTHER): Admitting: Obstetrics and Gynecology

## 2018-03-17 VITALS — BP 104/66 | Wt 104.0 lb

## 2018-03-17 DIAGNOSIS — Z1379 Encounter for other screening for genetic and chromosomal anomalies: Secondary | ICD-10-CM

## 2018-03-17 DIAGNOSIS — Z23 Encounter for immunization: Secondary | ICD-10-CM | POA: Diagnosis not present

## 2018-03-17 DIAGNOSIS — Z3490 Encounter for supervision of normal pregnancy, unspecified, unspecified trimester: Secondary | ICD-10-CM

## 2018-03-17 DIAGNOSIS — Z3A10 10 weeks gestation of pregnancy: Secondary | ICD-10-CM

## 2018-03-17 DIAGNOSIS — O208 Other hemorrhage in early pregnancy: Secondary | ICD-10-CM

## 2018-03-17 LAB — POCT URINALYSIS DIPSTICK OB
Glucose, UA: NEGATIVE
PROTEIN: NEGATIVE

## 2018-03-17 NOTE — Addendum Note (Signed)
Addended by: [place], Joyous Gleghorn B on: 03/17/2018 03:31 PM   Modules accepted: Orders

## 2018-03-17 NOTE — Progress Notes (Signed)
Routine Prenatal Care Visit  Subjective  Vickie Manning is a 27 y.o. G1P0000 at [redacted]w[redacted]d being seen today for ongoing prenatal care.  She is currently monitored for the following issues for this low-risk pregnancy and has Vaginal bleeding in pregnancy, first trimester and Encounter for supervision of normal pregnancy, antepartum on their problem list.  ----------------------------------------------------------------------------------- Patient reports no complaints.  Rash on legs Contractions: Not present. Vag. Bleeding: None.  Movement: Absent. Denies leaking of fluid.  ----------------------------------------------------------------------------------- The following portions of the patient's history were reviewed and updated as appropriate: allergies, current medications, past family history, past medical history, past social history, past surgical history and problem list. Problem list updated.   Objective  Blood pressure 104/66, weight 104 lb (47.2 kg), last menstrual period 12/17/2017. Pregravid weight 105 lb (47.6 kg) Total Weight Gain -1 lb (-0.454 kg) Urinalysis:      Fetal Status: Fetal Heart Rate (bpm): 170   Movement: Absent     General:  Alert, oriented and cooperative. Patient is in no acute distress.  Skin: Skin is warm and dry. No rash noted.   Cardiovascular: Normal heart rate noted  Respiratory: Normal respiratory effort, no problems with respiration noted  Abdomen: Soft, gravid, appropriate for gestational age. Pain/Pressure: Absent     Pelvic:  Cervical exam deferred        Extremities: Normal range of motion.     ental Status: Normal mood and affect. Normal behavior. Normal judgment and thought content.   Small raised red rash on anterior thighs  Assessment   27 y.o. G1P0000 at [redacted]w[redacted]d by  10/08/2018, by Ultrasound presenting for routine prenatal visit  Plan   Pregnancy #1 Problems (from 12/17/17 to present)    Problem Noted Resolved   Encounter for supervision  of normal pregnancy, antepartum 03/08/2018 by [name], MD No   Overview Signed 03/08/2018  1:21 PM by [name], MD    [facility] Prenatal Labs  Dating 6 week [place] Blood type: O positive  Genetic Screen 1 Screen:    AFP:     Quad:     NIPS: Antibody:negative  Anatomic [place]  Rubella: Immune Varicella: Immune  GTT Early:               Third trimester:  RPR: NR  Rhogam  HBsAg: Negative  TDaP vaccine                       Flu Shot: HIV: Negative  Baby Food                                GBS:   Contraception  Pap: ASCUS HPV positive 02/13/18  CBB     CS/VBAC N/A   Support Person            Vaginal bleeding in pregnancy, first trimester 02/13/2018 by Will Bonnet, MD No       Gestational age appropriate obstetric precautions including but not limited to vaginal bleeding, contractions, leaking of fluid and fetal movement were reviewed in detail with the patient.   - recommend cessation of shaving - benadryl and topical hydrocortisone - Discussed benefits of influenza vaccination during pregnancy.  The CDC recommends influenza vaccination for all pregnant patient regardless of trimester.  Besides the benefits of preventing influenza in the mother we discussed transfer of maternal antibodies to fetus as well as secretion of antibodies in breast milk which  are protective for the infant.    In case of positive influenza test in the patient or a close household contact, the use of Tamiflu is also recommended by the CDC.      Return in about 2 weeks (around 03/31/2018) for ROB and 1st trimester [place].  [name], MD, [facility], [facility] 03/17/2018, 3:27 PM

## 2018-03-17 NOTE — Progress Notes (Signed)
ROB  Rash on legs/itching

## 2018-03-28 ENCOUNTER — Telehealth: Payer: Self-pay

## 2018-03-28 ENCOUNTER — Other Ambulatory Visit: Payer: Self-pay | Admitting: Advanced Practice Midwife

## 2018-03-28 DIAGNOSIS — R519 Headache, unspecified: Secondary | ICD-10-CM

## 2018-03-28 DIAGNOSIS — R51 Headache: Principal | ICD-10-CM

## 2018-03-28 DIAGNOSIS — O26899 Other specified pregnancy related conditions, unspecified trimester: Secondary | ICD-10-CM

## 2018-03-28 MED ORDER — BUTALBITAL-APAP-CAFFEINE 50-325-40 MG PO TABS: 1.0000 | ORAL_TABLET | Freq: Four times a day (QID) | ORAL | 0 refills | Status: DC | PRN

## 2018-03-28 NOTE — Telephone Encounter (Signed)
Please let her know that the Rx for Fioricet is in an envelope at the front waiting for her.

## 2018-03-28 NOTE — Telephone Encounter (Signed)
Pt is 12 wks; magnesium and tylenol are not helping with H/As.  She talked with her sister who said fiorcet helped c her H/As.  Would it be possible to get a rx to see if it would keep H/As from turning into  Migraines?  [phone]

## 2018-03-28 NOTE — Telephone Encounter (Signed)
Please advise 

## 2018-03-28 NOTE — Progress Notes (Signed)
Rx printed for patient and will be at the front in envelope for her

## 2018-03-29 ENCOUNTER — Other Ambulatory Visit: Payer: Self-pay | Admitting: Advanced Practice Midwife

## 2018-03-29 DIAGNOSIS — R51 Headache: Principal | ICD-10-CM

## 2018-03-29 DIAGNOSIS — R519 Headache, unspecified: Secondary | ICD-10-CM

## 2018-03-29 DIAGNOSIS — O26899 Other specified pregnancy related conditions, unspecified trimester: Secondary | ICD-10-CM

## 2018-03-29 MED ORDER — BUTALBITAL-APAP-CAFFEINE 50-325-40 MG PO TABS: 1.0000 | ORAL_TABLET | Freq: Four times a day (QID) | ORAL | 0 refills | Status: DC | PRN

## 2018-03-29 NOTE — Telephone Encounter (Signed)
Pt aware.

## 2018-03-29 NOTE — Progress Notes (Signed)
Rx printed for patient.

## 2018-03-31 ENCOUNTER — Ambulatory Visit (INDEPENDENT_AMBULATORY_CARE_PROVIDER_SITE_OTHER)

## 2018-03-31 ENCOUNTER — Ambulatory Visit (INDEPENDENT_AMBULATORY_CARE_PROVIDER_SITE_OTHER): Admitting: Obstetrics and Gynecology

## 2018-03-31 VITALS — BP 92/50 | Wt 109.0 lb

## 2018-03-31 DIAGNOSIS — Z3490 Encounter for supervision of normal pregnancy, unspecified, unspecified trimester: Secondary | ICD-10-CM

## 2018-03-31 DIAGNOSIS — Z3682 Encounter for antenatal screening for nuchal translucency: Secondary | ICD-10-CM | POA: Diagnosis not present

## 2018-03-31 DIAGNOSIS — Z1379 Encounter for other screening for genetic and chromosomal anomalies: Secondary | ICD-10-CM

## 2018-03-31 DIAGNOSIS — Z3401 Encounter for supervision of normal first pregnancy, first trimester: Secondary | ICD-10-CM

## 2018-03-31 DIAGNOSIS — Z3A12 12 weeks gestation of pregnancy: Secondary | ICD-10-CM

## 2018-03-31 LAB — POCT URINALYSIS DIPSTICK OB
GLUCOSE, UA: NEGATIVE
PROTEIN: NEGATIVE

## 2018-03-31 NOTE — Progress Notes (Signed)
Routine Prenatal Care Visit  Subjective  Vickie Manning is a 27 y.o. G1P0000 at [redacted]w[redacted]d being seen today for ongoing prenatal care.  She is currently monitored for the following issues for this low-risk pregnancy and has Vaginal bleeding in pregnancy, first trimester and Encounter for supervision of normal pregnancy, antepartum on their problem list.  ----------------------------------------------------------------------------------- Patient reports no complaints.   Contractions: Not present. Vag. Bleeding: None.  Movement: Absent. Denies leaking of fluid.  ----------------------------------------------------------------------------------- The following portions of the patient's history were reviewed and updated as appropriate: allergies, current medications, past family history, past medical history, past social history, past surgical history and problem list. Problem list updated.   Objective  Blood pressure (!) 92/50, weight 109 lb (49.4 kg), last menstrual period 12/17/2017. Pregravid weight 105 lb (47.6 kg) Total Weight Gain 4 lb (1.814 kg) Urinalysis:      Fetal Status: Fetal Heart Rate (bpm): 155   Movement: Absent     General:  Alert, oriented and cooperative. Patient is in no acute distress.  Skin: Skin is warm and dry. No rash noted.   Cardiovascular: Normal heart rate noted  Respiratory: Normal respiratory effort, no problems with respiration noted  Abdomen: Soft, gravid, appropriate for gestational age. Pain/Pressure: Absent     Pelvic:  Cervical exam deferred        Extremities: Normal range of motion.     ental Status: Normal mood and affect. Normal behavior. Normal judgment and thought content.   US Fetal Nuchal Translucency Measurement  Result Date: 03/31/2018 Patient Name: Vickie Manning DOB: [birth year] MRN: [id number] ULTRASOUND REPORT Location: [facility] Date of Service: 03/31/2018 Indications:First Trimester Screen - NT Findings: Singleton intrauterine pregnancy  is visualized with a CRL consistent with [redacted]w[redacted]d  gestation, giving an (U/S) EDD of 10/07/2018. The (U/S) EDD is consistent with the clinically established Estimated Date of Delivery: 10/08/18. FHR: 172 BPM CRL measurement: 65.6 mm NT measurement: 1.6 mm. Yolk sac is not seen-placenta formed Amnion is visualized. Nasal Bone is Present Right Ovary is normal in appearance. Left Ovary is normal in appearance. Survey of the adnexa demonstrates no adnexal masses. There is no free peritoneal fluid in the cul de sac. Impression: 1. [redacted]w[redacted]d viable Singleton Intrauterine pregnancy by U/S. 2. (U/S) EDD is consistent with Clinically established Estimated Date of Delivery: 10/08/18. 3. NT Screen is successfully completed. Recommendations: 1.Clinical correlation with the patient's History and Physical Exam. Vita Barley, RDMS RVT Mital Patel, RDMS There is a viable  singleton gestation.  The fetal biometry correlates with established dating Detailed evaluation of the fetal anatomy is precluded by early gestational age.The NT measurement is less than 68mm.    It must be noted that a normal ultrasound is unable to definitively rule out fetal aneuploidy.  [name], MD, [facility], [facility] 03/31/2018, 3:25 PM.     Assessment   27 y.o. G1P0000 at [redacted]w[redacted]d by  10/08/2018, by Ultrasound presenting for routine prenatal visit  Plan   Pregnancy #1 Problems (from 12/17/17 to present)    Problem Noted Resolved   Encounter for supervision of normal pregnancy, antepartum 03/08/2018 by [name], MD No   Overview Signed 03/08/2018  1:21 PM by [name], MD    Clinic [facility] Prenatal Labs  Dating 6 week [place] Blood type: O positive  Genetic Screen 1 Screen:    AFP:     Quad:     NIPS: Antibody:negative  Anatomic [place]  Rubella: Immune Varicella: Immune  GTT  Early:               Third trimester:  RPR: NR  Rhogam  HBsAg: Negative  TDaP vaccine                       Flu Shot: HIV: Negative    Baby Food                                GBS:   Contraception  Pap: ASCUS HPV positive 02/13/18  CBB     CS/VBAC N/A   Support Person            Vaginal bleeding in pregnancy, first trimester 02/13/2018 by Will Bonnet, MD No       Gestational age appropriate obstetric precautions including but not limited to vaginal bleeding, contractions, leaking of fluid and fetal movement were reviewed in detail with the patient.   - NT screen today  Return in about 4 weeks (around 04/28/2018) for [place].  [name], MD, [facility], [facility] 03/31/2018, 3:25 PM

## 2018-03-31 NOTE — Progress Notes (Signed)
ROB 1st trimester screen

## 2018-04-03 LAB — FIRST TRIMESTER SCREEN W/NT
CRL: 65.6 mm
DIA MOM: 1.01
DIA VALUE: 287.2 pg/mL
GEST AGE-COLLECT: 12.7 wk
MATERNAL AGE AT EDD: 27.1 a
Nuchal Translucency MoM: 1.08
Nuchal Translucency: 1.6 mm
Number of Fetuses: 1
PAPP-A MoM: 0.83
PAPP-A Value: 1242.4 ng/mL
TEST RESULTS: NEGATIVE
Weight: 109 [lb_av]
hCG MoM: 1.25
hCG Value: 137.9 IU/mL

## 2018-04-08 ENCOUNTER — Encounter: Payer: Self-pay | Admitting: *Deleted

## 2018-04-08 ENCOUNTER — Other Ambulatory Visit: Payer: Self-pay | Admitting: Advanced Practice Midwife

## 2018-04-08 DIAGNOSIS — O26899 Other specified pregnancy related conditions, unspecified trimester: Secondary | ICD-10-CM

## 2018-04-08 DIAGNOSIS — R51 Headache: Principal | ICD-10-CM

## 2018-04-08 DIAGNOSIS — R519 Headache, unspecified: Secondary | ICD-10-CM

## 2018-04-10 NOTE — Telephone Encounter (Signed)
Please advise 

## 2018-04-18 ENCOUNTER — Telehealth: Payer: Self-pay

## 2018-04-18 NOTE — Telephone Encounter (Signed)
Pt calling, she's had a lot of people tell her it's okay to have some wine during preg.  Pt calling to see if it's true or not.  [phone]  Adv no alcohol of any kind during any part of the pregnancy.

## 2018-04-25 ENCOUNTER — Other Ambulatory Visit: Payer: Self-pay | Admitting: Advanced Practice Midwife

## 2018-04-25 DIAGNOSIS — O26899 Other specified pregnancy related conditions, unspecified trimester: Secondary | ICD-10-CM

## 2018-04-25 DIAGNOSIS — R519 Headache, unspecified: Secondary | ICD-10-CM

## 2018-04-25 DIAGNOSIS — R51 Headache: Principal | ICD-10-CM

## 2018-04-25 NOTE — Telephone Encounter (Signed)
Please advise 

## 2018-05-01 ENCOUNTER — Ambulatory Visit (INDEPENDENT_AMBULATORY_CARE_PROVIDER_SITE_OTHER): Admitting: Obstetrics and Gynecology

## 2018-05-01 ENCOUNTER — Encounter: Payer: Self-pay | Admitting: Obstetrics and Gynecology

## 2018-05-01 VITALS — BP 100/70 | Wt 113.0 lb

## 2018-05-01 DIAGNOSIS — Z3A17 17 weeks gestation of pregnancy: Secondary | ICD-10-CM

## 2018-05-01 DIAGNOSIS — Z3402 Encounter for supervision of normal first pregnancy, second trimester: Secondary | ICD-10-CM

## 2018-05-01 DIAGNOSIS — Z3490 Encounter for supervision of normal pregnancy, unspecified, unspecified trimester: Secondary | ICD-10-CM

## 2018-05-01 DIAGNOSIS — Z363 Encounter for antenatal screening for malformations: Secondary | ICD-10-CM

## 2018-05-01 LAB — POCT URINALYSIS DIPSTICK OB
Glucose, UA: NEGATIVE
PROTEIN: NEGATIVE

## 2018-05-01 NOTE — Progress Notes (Signed)
    Routine Prenatal Care Visit  Subjective  Vickie Manning is a 27 y.o. G1P0000 at [redacted]w[redacted]d being seen today for ongoing prenatal care.  She is currently monitored for the following issues for this low-risk pregnancy and has Vaginal bleeding in pregnancy, first trimester; Encounter for supervision of normal pregnancy, antepartum; and Anxiety disorder on their problem list.  ----------------------------------------------------------------------------------- Patient reports no complaints.   Contractions: Not present. Vag. Bleeding: None.  Movement: Present. Denies leaking of fluid.  ----------------------------------------------------------------------------------- The following portions of the patient's history were reviewed and updated as appropriate: allergies, current medications, past family history, past medical history, past social history, past surgical history and problem list. Problem list updated.   Objective  Blood pressure 100/70, weight 113 lb (51.3 kg), last menstrual period 12/17/2017. Pregravid weight 105 lb (47.6 kg) Total Weight Gain 8 lb (3.629 kg) Urinalysis:      Fetal Status:     Movement: Present     General:  Alert, oriented and cooperative. Patient is in no acute distress.  Skin: Skin is warm and dry. No rash noted.   Cardiovascular: Normal heart rate noted  Respiratory: Normal respiratory effort, no problems with respiration noted  Abdomen: Soft, gravid, appropriate for gestational age. Pain/Pressure: Present     Pelvic:  Cervical exam deferred        Extremities: Normal range of motion.     ental Status: Normal mood and affect. Normal behavior. Normal judgment and thought content.     Assessment   27 y.o. G1P0000 at [redacted]w[redacted]d by  10/08/2018, by Ultrasound presenting for routine prenatal visit  Plan   Pregnancy #1 Problems (from 12/17/17 to present)    Problem Noted Resolved   Encounter for supervision of normal pregnancy, antepartum 03/08/2018 by [name], MD No   Overview Addendum 04/04/2018 12:54 PM by [name], MD    Clinic [facility] Prenatal Labs  Dating 6 week [place] Blood type: O positive  Genetic Screen 1 Screen:negative  AFP:       Antibody:negative  Anatomic [place]  Rubella: Immune Varicella: Immune  GTT  RPR: NR  Rhogam  HBsAg: Negative  TDaP vaccine                       Flu Shot: HIV: Negative  Baby Food                                GBS:   Contraception  Pap: ASCUS HPV positive 02/13/18  CBB     CS/VBAC N/A   Support Person            Vaginal bleeding in pregnancy, first trimester 02/13/2018 by Will Bonnet, MD No       Gestational age appropriate obstetric precautions including but not limited to vaginal bleeding, contractions, leaking of fluid and fetal movement were reviewed in detail with the patient.    Return in about 3 weeks (around 05/22/2018) for ROB and anatomy scan.  [name], MD, [facility], [facility] 05/01/2018, 10:30 AM

## 2018-05-01 NOTE — Progress Notes (Signed)
ROB

## 2018-05-10 ENCOUNTER — Other Ambulatory Visit: Payer: Self-pay | Admitting: Obstetrics & Gynecology

## 2018-05-10 DIAGNOSIS — R51 Headache: Principal | ICD-10-CM

## 2018-05-10 DIAGNOSIS — R519 Headache, unspecified: Secondary | ICD-10-CM

## 2018-05-10 DIAGNOSIS — O26899 Other specified pregnancy related conditions, unspecified trimester: Secondary | ICD-10-CM

## 2018-05-11 ENCOUNTER — Telehealth: Payer: Self-pay

## 2018-05-11 ENCOUNTER — Other Ambulatory Visit: Payer: Self-pay | Admitting: Advanced Practice Midwife

## 2018-05-11 DIAGNOSIS — R519 Headache, unspecified: Secondary | ICD-10-CM

## 2018-05-11 DIAGNOSIS — R51 Headache: Principal | ICD-10-CM

## 2018-05-11 DIAGNOSIS — O26899 Other specified pregnancy related conditions, unspecified trimester: Secondary | ICD-10-CM

## 2018-05-11 MED ORDER — BUTALBITAL-APAP-CAFFEINE 50-325-40 MG PO TABS: ORAL_TABLET | ORAL | 1 refills | Status: DC

## 2018-05-11 NOTE — Progress Notes (Signed)
Patient still having headaches which sometimes last for 2 days. Fioricet has been helping. She is not out of medication yet but she is going to travel and would like to have a refill to take with her. Rx printed and she will pick up at office.

## 2018-05-11 NOTE — Telephone Encounter (Signed)
Patient states she was seen by AMS last week for follow up appointment. But it was ROB. Will send message to provider to see if we need an additional appointment to have a refill for  fioricet . Routning message to Simla who originally prescribe medication

## 2018-05-11 NOTE — Telephone Encounter (Signed)
Pt calling to see why fioricet was denied.  She is going out of town soon and trying to get everything in order.  [phone]  Left detailed msg it was denied b/c she needs a f/u appt to see how she is doing on the medication.  We do have openings today.  To call and sched.

## 2018-05-11 NOTE — Telephone Encounter (Signed)
Rx Fioricet printed for patient to pick up.

## 2018-05-24 NOTE — L&D Delivery Note (Signed)
Delivery Note At 10:45 PM a viable female was delivered via Vaginal, Spontaneous (Presentation: ROA).  APGAR: 7, 9; weight  .   Placenta status:spontanous, intact 20 minutes post delivery.  Cord: 3VC, loose nuchal x 1 with the following complications:some continued oozing post delivery, 887mcg of Cytotec placed rectally, bladder straight cathed, with appropriate lochia post these interventions.  Cord pH: N/A  Anesthesia:  Epidural Episiotomy: None Lacerations: None Suture Repair: none Est. Blood Loss (mL): 400 EBL  Mom to postpartum.  Baby to Couplet care / Skin to Skin.  [name] 10/06/2018, 11:15 PM

## 2018-05-30 ENCOUNTER — Ambulatory Visit (INDEPENDENT_AMBULATORY_CARE_PROVIDER_SITE_OTHER): Admitting: Obstetrics and Gynecology

## 2018-05-30 ENCOUNTER — Ambulatory Visit (INDEPENDENT_AMBULATORY_CARE_PROVIDER_SITE_OTHER)

## 2018-05-30 VITALS — BP 102/74 | Wt 121.0 lb

## 2018-05-30 DIAGNOSIS — IMO0002 Reserved for concepts with insufficient information to code with codable children: Secondary | ICD-10-CM

## 2018-05-30 DIAGNOSIS — Z363 Encounter for antenatal screening for malformations: Secondary | ICD-10-CM

## 2018-05-30 DIAGNOSIS — Z3490 Encounter for supervision of normal pregnancy, unspecified, unspecified trimester: Secondary | ICD-10-CM

## 2018-05-30 DIAGNOSIS — Z3A21 21 weeks gestation of pregnancy: Secondary | ICD-10-CM

## 2018-05-30 DIAGNOSIS — Z0489 Encounter for examination and observation for other specified reasons: Secondary | ICD-10-CM

## 2018-05-30 DIAGNOSIS — Z3492 Encounter for supervision of normal pregnancy, unspecified, second trimester: Secondary | ICD-10-CM

## 2018-05-30 LAB — POCT URINALYSIS DIPSTICK OB
GLUCOSE, UA: NEGATIVE
POC,PROTEIN,UA: NEGATIVE

## 2018-05-30 NOTE — Progress Notes (Signed)
ROB  °Anatomy scan °

## 2018-05-30 NOTE — Progress Notes (Addendum)
Routine Prenatal Care Visit  Subjective  Vickie Manning is a 28 y.o. G1P0000 at 65w2dbeing seen today for ongoing prenatal care.  She is currently monitored for the following issues for this low-risk pregnancy and has Vaginal bleeding in pregnancy, first trimester; Encounter for supervision of normal pregnancy, antepartum; and Anxiety disorder on their problem list.  ----------------------------------------------------------------------------------- Patient reports no complaints.   Contractions: Not present. Vag. Bleeding: None.  Movement: Present. Denies leaking of fluid.  ----------------------------------------------------------------------------------- The following portions of the patient's history were reviewed and updated as appropriate: allergies, current medications, past family history, past medical history, past social history, past surgical history and problem list. Problem list updated.   Objective  Blood pressure 102/74, weight 121 lb (54.9 kg), last menstrual period 12/17/2017. Pregravid weight 105 lb (47.6 kg) Total Weight Gain 16 lb (7.258 kg) Urinalysis:      Fetal Status: Fetal Heart Rate (bpm): 145   Movement: Present     General:  Alert, oriented and cooperative. Patient is in no acute distress.  Skin: Skin is warm and dry. No rash noted.   Cardiovascular: Normal heart rate noted  Respiratory: Normal respiratory effort, no problems with respiration noted  Abdomen: Soft, gravid, appropriate for gestational age. Pain/Pressure: Absent     Pelvic:  Cervical exam deferred        Extremities: Normal range of motion.     ental Status: Normal mood and affect. Normal behavior. Normal judgment and thought content.   [place]Ob Comp + 14 Wk  Result Date: 05/30/2018 Patient Name: Vickie SekiDOB: 431-Aug-1992MRN: [id number] [facility] Date of Service: 05/30/2018 Indications:Anatomy Ultrasound Findings: SNelda Marseilleintrauterine pregnancy is visualized  with FHR at 145 BPM. Biometrics give an (U/S) Gestational age of 272w2dnd an (U/S) EDD of 10/08/18; this correlates with the clinically established Estimated Date of Delivery: 10/08/18 Fetal presentation is Breech. EFW: 402g (14oz). Placenta: anterior. Grade: 1. Cervix: 4.7cm. AFI: subjectively normal. Anatomic survey is incomplete for 4 chamber heart and outflow tracts; Gender - female.  Right Ovary is normal in appearance. Left Ovary is normal appearance. Survey of the adnexa demonstrates no adnexal masses. There is no free peritoneal fluid in the cul de sac. Impression: 1. 2171w2dable Singleton Intrauterine pregnancy by U/S. 2. (U/S) EDD is consistent with Clinically established Estimated Date of Delivery: 10/08/18 . 3. Follow up cardiac views. Recommendations: 1.Clinical correlation with the patient's History and Physical Exam. AbbVita BarleyDMS RVT  There is a singleton gestation with subjectively normal amniotic fluid volume. The fetal biometry correlates with established dating. Detailed evaluation of the fetal anatomy was performed.The fetal anatomical survey appears within normal limits within the resolution of ultrasound as described above.  Cardiac view remain incomplete.  It must be noted that a normal ultrasound is unable to rule out fetal aneuploidy.  AndMalachy MoodD, FACNewell/GYN, [facility] 05/30/2018, 9:15 AM   Immunization History  Administered Date(s) Administered  . DTaP 10/30/1991, 01/12/1995, 12/04/1996  . Hepatitis B 04/10-13-936/12/1991, 01/12/1995  . HiB (PRP-OMP) 11/29/1991, 01/12/1995  . Hpv 06/04/2010, 07/30/2010, 12/03/2010  . IPV 01/12/1995, 12/04/1996  . Influenza,inj,Quad PF,6+ Mos 03/17/2018  . Influenza-Unspecified 06/04/2010  . MMR 12/04/1996  . Tdap 06/04/2010     Assessment   [age]o. G1P0000 at 21w34w2d 10/08/2018, by Ultrasound presenting for routine prenatal visit  Plan   Pregnancy #1 Problems (from 12/17/17 to present)     Problem Noted Resolved  Encounter for supervision of normal pregnancy, antepartum 03/08/2018 by [name], MD No   Overview Addendum 05/30/2018  9:17 AM by [name], MD    Clinic [facility] Prenatal Labs  Dating 6 week [place] Blood type: O positive  Genetic Screen 1 Screen:negative  :       Antibody:negative  Anatomic [place] Incomplete 4 chamber, Female [ ]  follow up 24 weeks Rubella: Immune Varicella: Immune  GTT  RPR: NR  Rhogam N/A HBsAg: Negative  TDaP vaccine                       Flu Shot: had HIV: Negative  Baby Food                                GBS:   Contraception  Pap: ASCUS HPV positive 02/13/18  CBB     CS/VBAC N/A   Support Person            Vaginal bleeding in pregnancy, first trimester 02/13/2018 by Will Bonnet, MD No       Gestational age appropriate obstetric precautions including but not limited to vaginal bleeding, contractions, leaking of fluid and fetal movement were reviewed in detail with the patient.    Return in about 3 weeks (around 06/20/2018) for ROB and follow up anatomy scan.  [name], MD, [facility], [facility] 05/30/2018, 9:38 AM

## 2018-06-02 ENCOUNTER — Telehealth: Payer: Self-pay

## 2018-06-02 ENCOUNTER — Ambulatory Visit (INDEPENDENT_AMBULATORY_CARE_PROVIDER_SITE_OTHER): Admitting: Obstetrics & Gynecology

## 2018-06-02 VITALS — BP 120/80 | Wt 121.0 lb

## 2018-06-02 DIAGNOSIS — K625 Hemorrhage of anus and rectum: Secondary | ICD-10-CM | POA: Diagnosis not present

## 2018-06-02 DIAGNOSIS — K649 Unspecified hemorrhoids: Secondary | ICD-10-CM

## 2018-06-02 DIAGNOSIS — Z331 Pregnant state, incidental: Secondary | ICD-10-CM | POA: Diagnosis not present

## 2018-06-02 NOTE — Patient Instructions (Signed)
Anusol HC over the counter for hemorrhoids, Use twice daily for 5-7 days, then as needed

## 2018-06-02 NOTE — Telephone Encounter (Signed)
Pt called stating she had a BM that the pain of it almost made her black out.  She states there was an enormous amount of blood in the toilet.  BM was hard and almost black. Adv Prep H, tucks pads, and colace.  Pt anxious.  Tx'd to [facility] for appt today with it being the weekend and pt being anxious.

## 2018-06-02 NOTE — Progress Notes (Signed)
  Pt reports episode of rectal bleeding today after BM.  Blood in toilet.  Some pain w BM.  SHe has had occas constipation this pregnancy.  Currently 21 weeks.  No ctxs, pains, vag bleeding.    PMHx: She  has a past medical history of Anxiety and Depression. Also,  has a past surgical history that includes Appendectomy and Wisdom tooth extraction., family history includes Endometriosis in her sister.,  reports that she has never smoked. She has never used smokeless tobacco. She reports that she does not drink alcohol or use drugs.  She has a current medication list which includes the following prescription(s): butalbital-acetaminophen-caffeine, concept ob, and sertraline. Also, has No Known Allergies.  Review of Systems  All other systems reviewed and are negative.   Objective: BP 120/80   Wt 121 lb (54.9 kg)   LMP 12/17/2017 (Exact Date)   BMI 19.53 kg/m  Physical Exam Constitutional:      General: She is not in acute distress.    Appearance: She is well-developed.  Genitourinary:     Pelvic exam was performed with patient supine.     Vagina, cervix and uterus normal.     No vaginal erythema or bleeding.     No cervical polyp or nabothian cyst.     Uterus is mobile.     Uterus is not enlarged.     No uterine mass detected.    Uterus is midaxial.     No right or left adnexal mass present.     Right adnexa not tender.     Left adnexa not tender.     Genitourinary Comments: Cervix closed  No internal or thrombosed hemorrohoid; one small ext hem.  Rectum:     External hemorrhoid present.     No internal hemorrhoid.  HENT:     Head: Normocephalic and atraumatic.     Nose: Nose normal.  Abdominal:     General: There is no distension.     Palpations: Abdomen is soft.     Tenderness: There is no abdominal tenderness.     Comments: Gravid, NT, FHT 140  Musculoskeletal: Normal range of motion.  Neurological:     Mental Status: She is alert and oriented to person, place, and  time.     Cranial Nerves: No cranial nerve deficit.  Skin:    General: Skin is warm and dry.   ASSESSMENT/PLAN:   Problem List Items Addressed This Visit      Cardiovascular and Mediastinum   Hemorrhoids    Other Visit Diagnoses    Rectal bleeding    -  Primary    No s/sx pregnancy complication Anusol HC for treatment No sign of thrombosed hemorrhoid at this time  Barnett Applebaum, MD, [facility], [facility] 06/02/2018  4:01 PM

## 2018-06-05 ENCOUNTER — Telehealth: Payer: Self-pay

## 2018-06-05 NOTE — Telephone Encounter (Signed)
advise

## 2018-06-05 NOTE — Telephone Encounter (Signed)
No cream is about it and they may take 2 weeks to resolve

## 2018-06-05 NOTE — Telephone Encounter (Signed)
Pt was dx'd c hemorrhoids last Friday; has been using the cream 4xd and taking a stool softener and is still in a lot of pain and has swelling.  Is there anything else to take?  [phone]

## 2018-06-06 NOTE — Telephone Encounter (Addendum)
LVM for patient to call me back  I spoke to patient reassured her of length of time it usually takes for cream to completely work. I advised her using Tucks medicated pads and putting them in the freezer until ready to use helps with the swelling. She voiced an understanding.

## 2018-06-19 ENCOUNTER — Other Ambulatory Visit: Payer: Self-pay | Admitting: Advanced Practice Midwife

## 2018-06-19 DIAGNOSIS — R519 Headache, unspecified: Secondary | ICD-10-CM

## 2018-06-19 DIAGNOSIS — R51 Headache: Principal | ICD-10-CM

## 2018-06-19 DIAGNOSIS — O26899 Other specified pregnancy related conditions, unspecified trimester: Secondary | ICD-10-CM

## 2018-06-21 ENCOUNTER — Ambulatory Visit (INDEPENDENT_AMBULATORY_CARE_PROVIDER_SITE_OTHER)

## 2018-06-21 ENCOUNTER — Ambulatory Visit (INDEPENDENT_AMBULATORY_CARE_PROVIDER_SITE_OTHER): Admitting: Obstetrics and Gynecology

## 2018-06-21 VITALS — BP 100/70 | Wt 126.0 lb

## 2018-06-21 DIAGNOSIS — IMO0002 Reserved for concepts with insufficient information to code with codable children: Secondary | ICD-10-CM

## 2018-06-21 DIAGNOSIS — Z3490 Encounter for supervision of normal pregnancy, unspecified, unspecified trimester: Secondary | ICD-10-CM

## 2018-06-21 DIAGNOSIS — Z362 Encounter for other antenatal screening follow-up: Secondary | ICD-10-CM | POA: Diagnosis not present

## 2018-06-21 DIAGNOSIS — Z3A24 24 weeks gestation of pregnancy: Secondary | ICD-10-CM

## 2018-06-21 DIAGNOSIS — Z3492 Encounter for supervision of normal pregnancy, unspecified, second trimester: Secondary | ICD-10-CM

## 2018-06-21 DIAGNOSIS — Z0489 Encounter for examination and observation for other specified reasons: Secondary | ICD-10-CM

## 2018-06-21 LAB — POCT URINALYSIS DIPSTICK OB
Glucose, UA: NEGATIVE
PROTEIN: NEGATIVE

## 2018-06-21 NOTE — Progress Notes (Signed)
Routine Prenatal Care Visit  Subjective  Vickie Manning is a 28 y.o. G1P0000 at [redacted]w[redacted]d being seen today for ongoing prenatal care.  She is currently monitored for the following issues for this low-risk pregnancy and has Hemorrhage affecting first pregnancy; Encounter for supervision of normal pregnancy, antepartum; Anxiety disorder; and Hemorrhoids on their problem list.  ----------------------------------------------------------------------------------- Patient reports no complaints.   Contractions: Not present. Vag. Bleeding: None.  Movement: Present. Denies leaking of fluid.  ----------------------------------------------------------------------------------- The following portions of the patient's history were reviewed and updated as appropriate: allergies, current medications, past family history, past medical history, past social history, past surgical history and problem list. Problem list updated.   Objective  Blood pressure 100/70, weight 126 lb (57.2 kg), last menstrual period 12/17/2017. Pregravid weight 105 lb (47.6 kg) Total Weight Gain 21 lb (9.526 kg) Urinalysis:      Fetal Status: Fetal Heart Rate (bpm): 135   Movement: Present     General:  Alert, oriented and cooperative. Patient is in no acute distress.  Skin: Skin is warm and dry. No rash noted.   Cardiovascular: Normal heart rate noted  Respiratory: Normal respiratory effort, no problems with respiration noted  Abdomen: Soft, gravid, appropriate for gestational age. Pain/Pressure: Absent     Pelvic:  Cervical exam deferred        Extremities: Normal range of motion.     ental Status: Normal mood and affect. Normal behavior. Normal judgment and thought content.   US Ob Comp + 14 Wk  Result Date: 05/30/2018 Patient Name: Vickie Manning DOB: [birth year] MRN: [id number] ULTRASOUND REPORT Location: [facility] Date of Service: 05/30/2018 Indications:Anatomy Ultrasound Findings: [name] intrauterine pregnancy is  visualized with FHR at 145 BPM. Biometrics give an (U/S) Gestational age of [redacted]w[redacted]d and an (U/S) EDD of 10/08/18; this correlates with the clinically established Estimated Date of Delivery: 10/08/18 Fetal presentation is Breech. EFW: 402g (14oz). Placenta: anterior. Grade: 1. Cervix: 4.7cm. AFI: subjectively normal. Anatomic survey is incomplete for 4 chamber heart and outflow tracts; Gender - female.  Right Ovary is normal in appearance. Left Ovary is normal appearance. Survey of the adnexa demonstrates no adnexal masses. There is no free peritoneal fluid in the cul de sac. Impression: 1. [redacted]w[redacted]d Viable Singleton Intrauterine pregnancy by U/S. 2. (U/S) EDD is consistent with Clinically established Estimated Date of Delivery: 10/08/18 . 3. Follow up cardiac views. Recommendations: 1.Clinical correlation with the patient's History and Physical Exam. Vita Barley, RDMS RVT  There is a singleton gestation with subjectively normal amniotic fluid volume. The fetal biometry correlates with established dating. Detailed evaluation of the fetal anatomy was performed.The fetal anatomical survey appears within normal limits within the resolution of ultrasound as described above.  Cardiac view remain incomplete.  It must be noted that a normal ultrasound is unable to rule out fetal aneuploidy.  [name], MD, [facility], [facility] 05/30/2018, 9:15 AM   US Ob Follow Up  Result Date: 06/21/2018 Patient Name: Vickie Manning DOB: 1990/08/25 MRN: [id number] ULTRASOUND REPORT Location: [facility] Date of Service: 06/21/2018 Indications: Anatomy follow up ultrasound Findings: [name] intrauterine pregnancy is visualized with FHR at 144 BPM. Fetal presentation is Breech. Placenta: anterior. Grade: 1 AFI: subjectively normal. Anatomic survey is complete for 4 chamber heart and outflow tracts. There is no free peritoneal fluid in the cul de sac. Impression: 1. [redacted]w[redacted]d Viable Singleton Intrauterine pregnancy  previously established criteria. 2. Normal Anatomy Scan is now complete Recommendations: 1.Clinical correlation with  the patient's History and Physical Exam. Vita Barley, RDMS RVT There is a singleton gestation with subjectively normal amniotic fluid volume.  Limited evaluation of the fetal anatomy was performed today, focusing on on anatomic structures not fully visualized at the time of prior study.The visualized fetal anatomical survey appears within normal limits within the resolution of ultrasound as described above, and the anatomic survey is now complete.  It must be noted that a normal ultrasound is unable to rule out fetal aneuploidy.  [name], MD, [facility], [facility] 06/21/2018, 11:04 AM     Assessment   28 y.o. G1P0000 at [redacted]w[redacted]d by  10/08/2018, by Ultrasound presenting for routine prenatal visit  Plan   Pregnancy #1 Problems (from 12/17/17 to present)    Problem Noted Resolved   Encounter for supervision of normal pregnancy, antepartum 03/08/2018 by [name], MD No   Overview Addendum 05/30/2018  9:38 AM by [name], MD    Clinic [facility] Prenatal Labs  Dating 6 week [place] Blood type: O positive  Genetic Screen 1 Screen:negative  :       Antibody:negative  Anatomic [place] Incomplete 4 chamber, Female [X]  follow up 24 weeks complete Rubella: Immune Varicella: Immune  GTT  RPR: NR  Rhogam N/A HBsAg: Negative  TDaP vaccine                       Flu Shot: had this season HIV: Negative  Baby Food                                GBS:   Contraception  Pap: ASCUS HPV positive 02/13/18  CBB     CS/VBAC N/A   Support Person            Hemorrhage affecting first pregnancy 02/13/2018 by Will Bonnet, MD No       Gestational age appropriate obstetric precautions including but not limited to vaginal bleeding, contractions, leaking of fluid and fetal movement were reviewed in detail with the patient.    - anatomy scan complete  Return  in about 4 weeks (around 07/19/2018) for ROB and 28 week labs.  [name], MD, [facility], [facility] 06/21/2018, 10:07 AM

## 2018-06-26 ENCOUNTER — Telehealth: Payer: Self-pay

## 2018-06-26 NOTE — Telephone Encounter (Signed)
Pt called after hour nurse 06/25/18 6:01pm c/o otc cream she was adv to use for hemorrhoids doesn't seem to be working.  Now she has a bump in her perineal area that isn't going down. No pain at this time, no fever.  [phone]  Pt went to [facility]; spoke to pharm who sugg a diff cream that has annusol in it instead of hydrocortisone.  Pt wishes to try this cream and if it doesn't improve in 2-3 days she will call to be seen.

## 2018-07-02 ENCOUNTER — Inpatient Hospital Stay (HOSPITAL_COMMUNITY)
Admission: EM | Admit: 2018-07-02 | Discharge: 2018-07-02 | Disposition: A | Discharge status: Home / Self Care | Attending: Obstetrics and Gynecology | Admitting: Obstetrics and Gynecology

## 2018-07-02 ENCOUNTER — Other Ambulatory Visit: Payer: Self-pay

## 2018-07-02 ENCOUNTER — Encounter (HOSPITAL_COMMUNITY): Payer: Self-pay | Admitting: Emergency Medicine

## 2018-07-02 DIAGNOSIS — Z3A26 26 weeks gestation of pregnancy: Secondary | ICD-10-CM

## 2018-07-02 DIAGNOSIS — O9989 Other specified diseases and conditions complicating pregnancy, childbirth and the puerperium: Secondary | ICD-10-CM | POA: Diagnosis not present

## 2018-07-02 DIAGNOSIS — O4690 Antepartum hemorrhage, unspecified, unspecified trimester: Secondary | ICD-10-CM

## 2018-07-02 DIAGNOSIS — O26892 Other specified pregnancy related conditions, second trimester: Secondary | ICD-10-CM

## 2018-07-02 DIAGNOSIS — R1110 Vomiting, unspecified: Secondary | ICD-10-CM | POA: Diagnosis not present

## 2018-07-02 DIAGNOSIS — Z79899 Other long term (current) drug therapy: Secondary | ICD-10-CM | POA: Insufficient documentation

## 2018-07-02 DIAGNOSIS — Z3490 Encounter for supervision of normal pregnancy, unspecified, unspecified trimester: Secondary | ICD-10-CM

## 2018-07-02 DIAGNOSIS — N898 Other specified noninflammatory disorders of vagina: Secondary | ICD-10-CM | POA: Diagnosis not present

## 2018-07-02 LAB — WET PREP, GENITAL
Clue Cells Wet Prep HPF POC: NONE SEEN
Sperm: NONE SEEN
Trich, Wet Prep: NONE SEEN
Yeast Wet Prep HPF POC: NONE SEEN

## 2018-07-02 LAB — POCT FERN TEST: POCT Fern Test: NEGATIVE

## 2018-07-02 LAB — OB RESULTS CONSOLE GC/CHLAMYDIA: Gonorrhea: NEGATIVE

## 2018-07-02 NOTE — ED Notes (Signed)
[facility] called for transport to [facility]

## 2018-07-02 NOTE — Progress Notes (Signed)
Pt is a G1P0 at [redacted] weeks gestation. Says she gets her care at [facility] in [place]. She presents with c/o ROM around 1720. She says she was eating at a resturant, became nauseted, and vomited. Says she felt wet after that.1825 speculum exam done by the ED MD. No pooling of fluid. Cervix is closed.

## 2018-07-02 NOTE — ED Notes (Signed)
Paged rapid ob per RN

## 2018-07-02 NOTE — ED Notes (Signed)
Rapid OB at bedside 

## 2018-07-02 NOTE — Progress Notes (Signed)
Spoke with Dr. Rip Harbour. Pt is 26 weeks here with c/o ? ROM. No pooling was seen on speculum exam. Cervix was closed. Dr. Rip Harbour wants pt transferred to [facility], MAU for further evaluation.ED staff notified.

## 2018-07-02 NOTE — ED Provider Notes (Signed)
[facility] EMERGENCY DEPARTMENT Provider Note   CSN: [id number] Arrival date & time: 07/02/18  1750     History   Chief Complaint No chief complaint on file.   HPI Vickie Manning is a 28 y.o. female.  28 y.o female G1 P0 currently ~[redacted] weeks pregnant with a PMH of anxiety, depression presents to the ED s/p "gush of fluid" x 1 hour ago.Patient was out eating when she vomited and felt a gush of fluid reports she walked over the the bathroom and realized she had wet her pants. She denies any trauma or abdominal pain at this time. She denies any dysuria, abdominal pain, chest pain or shortness of breath.      Past Medical History:  Diagnosis Date  . Anxiety   . Depression     Patient Active Problem List   Diagnosis Date Noted  . Hemorrhoids 06/02/2018  . Encounter for supervision of normal pregnancy, antepartum 03/08/2018  . Hemorrhage affecting first pregnancy 02/13/2018  . Anxiety disorder 11/29/2017    Past Surgical History:  Procedure Laterality Date  . APPENDECTOMY    . WISDOM TOOTH EXTRACTION       OB History    Gravida  1   Para  0   Term  0   Preterm  0   AB      Living        SAB      TAB      Ectopic      Multiple      Live Births               Home Medications    Prior to Admission medications   Medication Sig Start Date End Date Taking? Authorizing Provider  butalbital-acetaminophen-caffeine (FIORICET, ESGIC) 50-325-40 MG tablet TAKE 1 TO 2 TABLETS BY MOUTH EVERY 6 HOURS AS NEEDED FOR HEADACHE 06/20/18   Rod Can, CNM  Prenat w/o A Vit-FeFum-FePo-FA (CONCEPT OB) 130-92.4-1 MG CAPS Take 1 capsule by mouth daily. 02/10/18   [name], MD  sertraline (ZOLOFT) 50 MG tablet Take 1 tablet (50 mg total) by mouth daily. Patient not taking: Reported on 03/17/2018 02/10/18   [name], MD    Family History Family History  Problem Relation Age of Onset  . Endometriosis Sister     Social  History Social History   Tobacco Use  . Smoking status: Never Smoker  . Smokeless tobacco: Never Used  Substance Use Topics  . Alcohol use: Never    Frequency: Never  . Drug use: Never     Allergies   Patient has no known allergies.   Review of Systems Review of Systems  Constitutional: Negative for chills and fever.  HENT: Negative for ear pain and sore throat.   Eyes: Negative for pain and visual disturbance.  Respiratory: Negative for cough and shortness of breath.   Cardiovascular: Negative for chest pain and palpitations.  Gastrointestinal: Negative for abdominal pain and vomiting.  Genitourinary: Positive for vaginal discharge. Negative for decreased urine volume, dysuria, hematuria, menstrual problem, pelvic pain, urgency, vaginal bleeding and vaginal pain.  Musculoskeletal: Negative for arthralgias and back pain.  Skin: Negative for color change and rash.  Neurological: Negative for seizures and syncope.  All other systems reviewed and are negative.    Physical Exam Updated Vital Signs BP 121/82   Pulse (!) 110   Resp 19   Ht 5\' 6"  (1.676 m)   Wt 59 kg   LMP 12/17/2017 (Exact Date)  SpO2 100%   BMI 20.98 kg/m   Physical Exam Vitals signs and nursing note reviewed. Exam conducted with a chaperone present.  Constitutional:      General: She is not in acute distress.    Appearance: She is well-developed.  HENT:     Head: Normocephalic and atraumatic.     Mouth/Throat:     Pharynx: No oropharyngeal exudate.  Eyes:     Pupils: Pupils are equal, round, and reactive to light.  Neck:     Musculoskeletal: Normal range of motion.  Cardiovascular:     Rate and Rhythm: Regular rhythm.     Heart sounds: Normal heart sounds.  Pulmonary:     Effort: Pulmonary effort is normal. No respiratory distress.     Breath sounds: Normal breath sounds.  Abdominal:     General: Bowel sounds are normal. There is no distension.     Palpations: Abdomen is soft.      Tenderness: There is no abdominal tenderness.  Genitourinary:    Labia:        Right: No rash or tenderness.        Left: No rash or tenderness.      Vagina: No foreign body. Vaginal discharge present. No erythema or bleeding.     Cervix: Normal.     Comments: Significant white discharge, cervix looks close not very clear to visualize. Mary RN at the bedside.  Musculoskeletal:        General: No tenderness or deformity.     Right lower leg: No edema.     Left lower leg: No edema.  Skin:    General: Skin is warm and dry.  Neurological:     Mental Status: She is alert and oriented to person, place, and time.      ED Treatments / Results  Labs (all labs ordered are listed, but only abnormal results are displayed) Labs Reviewed - No data to display  EKG None  Radiology No results found.  Procedures Procedures (including critical care time)  Medications Ordered in ED Medications - No data to display   Initial Impression / Assessment and Plan / ED Course  I have reviewed the triage vital signs and the nursing notes.  Pertinent labs & imaging results that were available during my care of the patient were reviewed by me and considered in my medical decision making (see chart for details).   Patient currently [redacted] weeks pregnant arrives in the ED after being out with husband eating reports she vomited then felt a rush of water to her pants. Patient reports no abdominal pain aside from the nausea. During speculum exam, patient has significant amount of white discharge, cervix appears closed. FHR at the bedside 150-155.Vitals are reassuring. [facility] rapid response advised patient would benefit from transfer to MAU for Dr. Darra Lis at [facility].    Final Clinical Impressions(s) / ED Diagnoses   Final diagnoses:  None    ED Discharge Orders    None       Janeece Fitting, Hershal Coria 07/02/18 1909    Lajean Saver, MD 07/02/18 1950

## 2018-07-02 NOTE — Discharge Instructions (Signed)

## 2018-07-02 NOTE — ED Triage Notes (Signed)
Pt was put eating when she got sick and vomited. Pt went to bathroom and noticed she leak clear fluid and both her underwear and pants were wet. Pt is [redacted] weeks pregnant.

## 2018-07-02 NOTE — MAU Provider Note (Addendum)
History     CSN: [id number]  Arrival date and time: 07/02/18 1750   First Provider Initiated Contact with Patient 07/02/18 2027      Chief Complaint  Patient presents with  . Rupture of Membranes   Vickie Manning is a 28 y.o. G1P0 at [redacted]w[redacted]d who presents for Rupture of Membranes.  She states she had an incident of fluid loss while vomiting after lunch.  She states the fluid was clear and caused her undergarments and leggings to become soaked.  She reports that she did not note any apparent smell.  She states she has not noted any leakage since her initial incident.  She denies vaginal discharge, itching, burning, odor, or bleeding and endorses sexual activity yesterday.  She further reports no perception of contractions and endorses good fetal movement.      OB History    Gravida  1   Para  0   Term  0   Preterm  0   AB      Living        SAB      TAB      Ectopic      Multiple      Live Births              Past Medical History:  Diagnosis Date  . Anxiety   . Depression     Past Surgical History:  Procedure Laterality Date  . APPENDECTOMY    . WISDOM TOOTH EXTRACTION      Family History  Problem Relation Age of Onset  . Endometriosis Sister     Social History   Tobacco Use  . Smoking status: Never Smoker  . Smokeless tobacco: Never Used  Substance Use Topics  . Alcohol use: Never    Frequency: Never  . Drug use: Never    Allergies: No Known Allergies  Medications Prior to Admission  Medication Sig Dispense Refill Last Dose  . butalbital-acetaminophen-caffeine (FIORICET, ESGIC) 50-325-40 MG tablet TAKE 1 TO 2 TABLETS BY MOUTH EVERY 6 HOURS AS NEEDED FOR HEADACHE 20 tablet 0 07/02/2018 at Unknown time  . Prenat w/o A Vit-FeFum-FePo-FA (CONCEPT OB) 130-92.4-1 MG CAPS Take 1 capsule by mouth daily. 30 capsule 11 07/01/2018 at Unknown time  . sertraline (ZOLOFT) 50 MG tablet Take 1 tablet (50 mg total) by mouth daily. (Patient not taking: Reported  on 03/17/2018) 30 tablet 2 More than a month at Unknown time    Review of Systems  Constitutional: Negative for chills and fever.  Gastrointestinal: Negative for constipation, diarrhea, nausea and vomiting.  Genitourinary: Negative for dyspareunia, dysuria, vaginal bleeding and vaginal discharge.  Musculoskeletal: Negative for back pain.  Neurological: Negative for dizziness, light-headedness and headaches.   Physical Exam   Blood pressure 102/80, pulse 77, temperature 98.3 F (36.8 C), temperature source Oral, resp. rate 18, height 5\' 6"  (1.676 m), weight 59 kg, last menstrual period 12/17/2017, SpO2 99 %.  Physical Exam  Constitutional: She is oriented to person, place, and time. She appears well-developed and well-nourished.  HENT:  Head: Normocephalic and atraumatic.  Eyes: Conjunctivae are normal.  Neck: Normal range of motion.  Cardiovascular: Normal rate, regular rhythm and normal heart sounds.  Respiratory: Effort normal and breath sounds normal.  GI: Soft. Bowel sounds are normal. She exhibits no distension. There is no abdominal tenderness.  Genitourinary: Cervix exhibits no motion tenderness and no discharge.    Vaginal discharge present.     No vaginal tenderness or bleeding.  No  tenderness or bleeding in the vagina.    Genitourinary Comments: Speculum Exam: -Vaginal Vault: Pink mucosa. Moderate amt thick white discharge in vault -wet prep collected *No pooling or fluid noted with valsalva maneuver -Cervix:Pink, no lesions, cysts, or polyps.  Appears closed. No active bleeding  from os-GC/CT collected -Bimanual Exam: Closed/Cervix feels short Uterus Gravid    Musculoskeletal: Normal range of motion.        General: No edema.  Neurological: She is alert and oriented to person, place, and time.  Skin: Skin is warm and dry.  Psychiatric: She has a normal mood and affect. Her behavior is normal.    Fetal Assessment 140 bpm, Mod Var, -Decels, Accel Toco: None  graphed  MAU Course   Results for orders placed or performed during the hospital encounter of 07/02/18 (from the past 24 hour(s))  Wet prep, genital     Status: Abnormal   Collection Time: 07/02/18  8:29 PM  Result Value Ref Range   Yeast Wet Prep HPF POC NONE SEEN NONE SEEN   Trich, Wet Prep NONE SEEN NONE SEEN   Clue Cells Wet Prep HPF POC NONE SEEN NONE SEEN   WBC, Wet Prep HPF POC FEW (A) NONE SEEN   Sperm NONE SEEN   Fern Test     Status: None   Collection Time: 07/02/18  9:10 PM  Result Value Ref Range   POCT Fern Test Negative = intact amniotic membranes     MDM Pelvic Exam Labs: Wet Prep, GC/CT, Fern   Assessment and Plan  7 year old G1 @ 26wks Cat I FT Vaginal Discharge  -Exam findings discussed -Questions and concerns addressed regarding providers perception of short cervix.  Patient encouraged to discuss with primary ob provider. -Wet prep pending -GC/CT collected and sent -Will await results.   Follow Up (21:28 PM)  -Wet prep returns with insignificant findings -Results discussed with patient -Informed that LOF most likely urine -No questions or concerns -NST Reactive -Preterm Labor Precautions Discussed -Encouraged to call primary Ob or return to MAU if symptoms worsen or with the onset of new symptoms. -Discharged to home in stable condition  Maryann Conners MSN, CNM 07/02/2018, 8:43 PM

## 2018-07-02 NOTE — Progress Notes (Signed)
Report given to Darrold Span, charge nurse, [facility], MAU.

## 2018-07-02 NOTE — MAU Note (Signed)
Pt states she had a trickle of fluid around 1800.  She was vomiting after dinner and noticed the trickle at the same time.  She has not noticed any trickling since.  Denies VB/pain. Reports good fetal movement.

## 2018-07-03 ENCOUNTER — Other Ambulatory Visit: Payer: Self-pay | Admitting: Advanced Practice Midwife

## 2018-07-03 DIAGNOSIS — R519 Headache, unspecified: Secondary | ICD-10-CM

## 2018-07-03 DIAGNOSIS — O26899 Other specified pregnancy related conditions, unspecified trimester: Secondary | ICD-10-CM

## 2018-07-03 DIAGNOSIS — R51 Headache: Principal | ICD-10-CM

## 2018-07-03 NOTE — Telephone Encounter (Signed)
Please advise 

## 2018-07-04 LAB — GC/CHLAMYDIA PROBE AMP (~~LOC~~) NOT AT ARMC
Chlamydia: NEGATIVE
Neisseria Gonorrhea: NEGATIVE

## 2018-07-07 ENCOUNTER — Telehealth: Payer: Self-pay | Admitting: Obstetrics and Gynecology

## 2018-07-07 ENCOUNTER — Telehealth: Payer: Self-pay

## 2018-07-07 ENCOUNTER — Other Ambulatory Visit: Payer: Self-pay | Admitting: Obstetrics and Gynecology

## 2018-07-07 DIAGNOSIS — O26872 Cervical shortening, second trimester: Secondary | ICD-10-CM

## 2018-07-07 NOTE — Telephone Encounter (Signed)
-----   Message from [name], MD sent at 07/07/2018 12:51 PM EST ----- Regarding: Cervical length ultrasound Can we add on cervical length ultrasound on with her 28 week labs    [name], MD, [place], [facility] 07/07/2018, 12:53 PM

## 2018-07-07 NOTE — Telephone Encounter (Signed)
Pt calling to f/u on hospital visit over the weekend at [facility].  They said her water broke; small ctxs; was monitored.  Wants to go over dx they gave her regarding her cervix.  Not sure if needs appt.  [phone]

## 2018-07-07 NOTE — Telephone Encounter (Signed)
Patient added to u/s at 8:30

## 2018-07-19 ENCOUNTER — Other Ambulatory Visit

## 2018-07-19 ENCOUNTER — Ambulatory Visit (INDEPENDENT_AMBULATORY_CARE_PROVIDER_SITE_OTHER): Admitting: Obstetrics and Gynecology

## 2018-07-19 ENCOUNTER — Other Ambulatory Visit: Payer: Self-pay | Admitting: Advanced Practice Midwife

## 2018-07-19 ENCOUNTER — Ambulatory Visit (INDEPENDENT_AMBULATORY_CARE_PROVIDER_SITE_OTHER)

## 2018-07-19 VITALS — BP 102/80 | Wt 132.0 lb

## 2018-07-19 DIAGNOSIS — Z3A28 28 weeks gestation of pregnancy: Secondary | ICD-10-CM

## 2018-07-19 DIAGNOSIS — Z3490 Encounter for supervision of normal pregnancy, unspecified, unspecified trimester: Secondary | ICD-10-CM

## 2018-07-19 DIAGNOSIS — Z3493 Encounter for supervision of normal pregnancy, unspecified, third trimester: Secondary | ICD-10-CM

## 2018-07-19 DIAGNOSIS — R51 Headache: Principal | ICD-10-CM

## 2018-07-19 DIAGNOSIS — O26899 Other specified pregnancy related conditions, unspecified trimester: Secondary | ICD-10-CM

## 2018-07-19 DIAGNOSIS — O26872 Cervical shortening, second trimester: Secondary | ICD-10-CM

## 2018-07-19 DIAGNOSIS — O26873 Cervical shortening, third trimester: Secondary | ICD-10-CM | POA: Diagnosis not present

## 2018-07-19 DIAGNOSIS — R519 Headache, unspecified: Secondary | ICD-10-CM

## 2018-07-19 LAB — POCT URINALYSIS DIPSTICK OB
Glucose, UA: NEGATIVE
POC,PROTEIN,UA: NEGATIVE

## 2018-07-19 NOTE — Progress Notes (Signed)
Routine Prenatal Care Visit  Subjective  Vickie Manning is a 28 y.o. G1P0000 at [redacted]w[redacted]d being seen today for ongoing prenatal care.  She is currently monitored for the following issues for this low-risk pregnancy and has Hemorrhage affecting first pregnancy; Encounter for supervision of normal pregnancy, antepartum; Anxiety disorder; and Hemorrhoids on their problem list.  ----------------------------------------------------------------------------------- Patient reports no complaints.   Contractions: Not present. Vag. Bleeding: None.  Movement: Present. Denies leaking of fluid.  ----------------------------------------------------------------------------------- The following portions of the patient's history were reviewed and updated as appropriate: allergies, current medications, past family history, past medical history, past social history, past surgical history and problem list. Problem list updated.   Objective  Blood pressure 102/80, weight 132 lb (59.9 kg), last menstrual period 12/17/2017. Pregravid weight 105 lb (47.6 kg) Total Weight Gain 27 lb (12.2 kg) Urinalysis:      Fetal Status: Fetal Heart Rate (bpm): 140 Fundal Height: 27 cm Movement: Present     General:  Alert, oriented and cooperative. Patient is in no acute distress.  Skin: Skin is warm and dry. No rash noted.   Cardiovascular: Normal heart rate noted  Respiratory: Normal respiratory effort, no problems with respiration noted  Abdomen: Soft, gravid, appropriate for gestational age. Pain/Pressure: Present     Pelvic:  Cervical exam deferred        Extremities: Normal range of motion.     ental Status: Normal mood and affect. Normal behavior. Normal judgment and thought content.   US Ob Follow Up  Result Date: 06/21/2018 Patient Name: Vickie Manning DOB: [birth year] MRN: [id number] ULTRASOUND REPORT Location: [facility] Date of Service: 06/21/2018 Indications: Anatomy follow up ultrasound Findings: Vickie Manning  intrauterine pregnancy is visualized with FHR at 144 BPM. Fetal presentation is Breech. Placenta: anterior. Grade: 1 AFI: subjectively normal. Anatomic survey is complete for 4 chamber heart and outflow tracts. There is no free peritoneal fluid in the cul de sac. Impression: 1. [redacted]w[redacted]d Viable Singleton Intrauterine pregnancy previously established criteria. 2. Normal Anatomy Scan is now complete Recommendations: 1.Clinical correlation with the patient's History and Physical Exam. Vita Barley, RDMS RVT There is a singleton gestation with subjectively normal amniotic fluid volume.  Limited evaluation of the fetal anatomy was performed today, focusing on on anatomic structures not fully visualized at the time of prior study.The visualized fetal anatomical survey appears within normal limits within the resolution of ultrasound as described above, and the anatomic survey is now complete.  It must be noted that a normal ultrasound is unable to rule out fetal aneuploidy.  [name], MD, [facility], [facility] 06/21/2018, 11:04 AM   US Ob Transvaginal  Result Date: 07/19/2018 Patient Name: Vickie Manning DOB: 03-21-1991 MRN: [id number] ULTRASOUND REPORT Location: [facility] Date of Service: 07/19/2018 Indications: Recent gush of fluids Findings: Breech singleton intrauterine pregnancy is visualized. FHR: 139 BPM Stomach:  visualized Kidneys: visualized Bladder: visualized Fluid appears normal visually with DVP measuring 7.6cm. Transvaginal cervical length performed. Cervical length measures  2.9 cm in the shortest dimension with mild funneling. There is no change with fundal pressure.  Impression: 1. [redacted]w[redacted]d viable Singleton Intrauterine pregnancy by previously established criteria. 2. Cervical length is 2.9 cm. Recommendations: 1.Clinical correlation with the patient's History and Physical Exam. Vita Barley, RDMS RVT There is a singleton gestation with normal amniotic fluid volume. The  visualized fetal anatomical survey appears within normal limits within the resolution of ultrasound as described above.  Cervical length is normal.  It  must be noted that a normal ultrasound is unable to rule out fetal aneuploidy.  [name], MD, [facility], [facility] 07/19/2018, 9:40 AM     Assessment   28 y.o. G1P0000 at [redacted]w[redacted]d by  10/08/2018, by Ultrasound presenting for routine prenatal visit  Plan   Pregnancy #1 Problems (from 12/17/17 to present)    Problem Noted Resolved   Encounter for supervision of normal pregnancy, antepartum 03/08/2018 by [name], MD No   Overview Addendum 06/22/2018  9:11 PM by [name], MD    [facility]inic [facility] Prenatal Labs  Dating 6 week [place] Blood type: O positive  Genetic Screen 1 Screen:negative  :       Antibody:negative  Anatomic [place] Incomplete 4 chamber, Female [X]  follow up 24 weeks complete Rubella: Immune Varicella: Immune  GTT  RPR: NR  Rhogam N/A HBsAg: Negative  TDaP vaccine                       Flu Shot: had this season HIV: Negative  Baby Food                                GBS:   Contraception  Pap: ASCUS HPV positive 02/13/18  CBB     CS/VBAC N/A   Support Person            Hemorrhage affecting first pregnancy 02/13/2018 by Will Bonnet, MD No       Gestational age appropriate obstetric precautions including but not limited to vaginal bleeding, contractions, leaking of fluid and fetal movement were reviewed in detail with the patient.    - normal cervical length - 28 week labs today  Return in about 2 weeks (around 08/02/2018) for ROB.  [name], MD, [facility], [facility] 07/19/2018, 9:39 AM

## 2018-07-19 NOTE — Addendum Note (Signed)
Addended by: Quintella Baton D on: 07/19/2018 09:45 AM   Modules accepted: Orders

## 2018-07-20 LAB — 28 WEEK RH+PANEL
Basophils Absolute: 0 10*3/uL (ref 0.0–0.2)
Basos: 0 %
EOS (ABSOLUTE): 0 10*3/uL (ref 0.0–0.4)
Eos: 0 %
Gestational Diabetes Screen: 110 mg/dL (ref 65–139)
HIV Screen 4th Generation wRfx: NONREACTIVE
Hematocrit: 35.2 % (ref 34.0–46.6)
Hemoglobin: 12.4 g/dL (ref 11.1–15.9)
Immature Grans (Abs): 0 10*3/uL (ref 0.0–0.1)
Immature Granulocytes: 0 %
LYMPHS ABS: 1.3 10*3/uL (ref 0.7–3.1)
Lymphs: 18 %
MCH: 33.4 pg — ABNORMAL HIGH (ref 26.6–33.0)
MCHC: 35.2 g/dL (ref 31.5–35.7)
MCV: 95 fL (ref 79–97)
Monocytes Absolute: 0.4 10*3/uL (ref 0.1–0.9)
Monocytes: 6 %
Neutrophils Absolute: 5.4 10*3/uL (ref 1.4–7.0)
Neutrophils: 76 %
Platelets: 215 10*3/uL (ref 150–450)
RBC: 3.71 x10E6/uL — ABNORMAL LOW (ref 3.77–5.28)
RDW: 12.7 % (ref 11.7–15.4)
RPR Ser Ql: NONREACTIVE
WBC: 7.3 10*3/uL (ref 3.4–10.8)

## 2018-07-20 NOTE — Telephone Encounter (Signed)
I generally don't prescribe this medication because of rebound headaches

## 2018-07-20 NOTE — Telephone Encounter (Signed)
advise

## 2018-07-20 NOTE — Telephone Encounter (Signed)
Pt has ROB with AMS on 3/12. Please advise

## 2018-07-21 ENCOUNTER — Other Ambulatory Visit: Payer: Self-pay

## 2018-07-21 DIAGNOSIS — R51 Headache: Principal | ICD-10-CM

## 2018-07-21 DIAGNOSIS — O26899 Other specified pregnancy related conditions, unspecified trimester: Secondary | ICD-10-CM

## 2018-07-21 DIAGNOSIS — R519 Headache, unspecified: Secondary | ICD-10-CM

## 2018-07-21 NOTE — Telephone Encounter (Signed)
Pt calling to ck on status of rx.  [phone]  Left detailed msg need to know what medication she is calling about.  If it's the generic of fiorcet, it was sent in 07/05/18.

## 2018-07-21 NOTE — Telephone Encounter (Signed)
Pt states she had a mishap with her medicine cabinet being over the sink.  She lost quit a few of her fiorcet.  Has a baby shower coming up and was hoping not to have to put up with a h/a during the shower.  [phone]

## 2018-08-03 ENCOUNTER — Other Ambulatory Visit: Payer: Self-pay

## 2018-08-03 ENCOUNTER — Ambulatory Visit (INDEPENDENT_AMBULATORY_CARE_PROVIDER_SITE_OTHER): Admitting: Obstetrics and Gynecology

## 2018-08-03 VITALS — BP 102/70 | Wt 137.0 lb

## 2018-08-03 DIAGNOSIS — Z23 Encounter for immunization: Secondary | ICD-10-CM

## 2018-08-03 DIAGNOSIS — Z3493 Encounter for supervision of normal pregnancy, unspecified, third trimester: Secondary | ICD-10-CM

## 2018-08-03 DIAGNOSIS — Z3490 Encounter for supervision of normal pregnancy, unspecified, unspecified trimester: Secondary | ICD-10-CM

## 2018-08-03 DIAGNOSIS — Z3A30 30 weeks gestation of pregnancy: Secondary | ICD-10-CM

## 2018-08-03 LAB — POCT URINALYSIS DIPSTICK OB
Glucose, UA: NEGATIVE
POC,PROTEIN,UA: NEGATIVE

## 2018-08-03 MED ORDER — PROCHLORPERAZINE MALEATE 10 MG PO TABS: 10.0000 mg | ORAL_TABLET | Freq: Four times a day (QID) | ORAL | 3 refills | Status: DC | PRN

## 2018-08-03 NOTE — Progress Notes (Signed)
    Routine Prenatal Care Visit  Subjective  Vickie Manning is a 28 y.o. G1P0000 at [redacted]w[redacted]d being seen today for ongoing prenatal care.  She is currently monitored for the following issues for this low-risk pregnancy and has Hemorrhage affecting first pregnancy; Encounter for supervision of normal pregnancy, antepartum; Anxiety disorder; and Hemorrhoids on their problem list.  ----------------------------------------------------------------------------------- Patient reports no complaints.   Contractions: Irregular. Vag. Bleeding: None.  Movement: Present. Denies leaking of fluid.  ----------------------------------------------------------------------------------- The following portions of the patient's history were reviewed and updated as appropriate: allergies, current medications, past family history, past medical history, past social history, past surgical history and problem list. Problem list updated.   Objective  Last menstrual period 12/17/2017. Pregravid weight 105 lb (47.6 kg) Total Weight Gain 27 lb (12.2 kg) Urinalysis:      Fetal Status: Fetal Heart Rate (bpm): 145 Fundal Height: 30 cm Movement: Present     General:  Alert, oriented and cooperative. Patient is in no acute distress.  Skin: Skin is warm and dry. No rash noted.   Cardiovascular: Normal heart rate noted  Respiratory: Normal respiratory effort, no problems with respiration noted  Abdomen: Soft, gravid, appropriate for gestational age. Pain/Pressure: Present     Pelvic:  Cervical exam deferred        Extremities: Normal range of motion.     ental Status: Normal mood and affect. Normal behavior. Normal judgment and thought content.     Assessment   28 y.o. G1P0000 at [redacted]w[redacted]d by  10/08/2018, by Ultrasound presenting for routine prenatal visit  Plan   Pregnancy #1 Problems (from 12/17/17 to present)    Problem Noted Resolved   Encounter for supervision of normal pregnancy, antepartum 03/08/2018 by [name], MD No   Overview Addendum 08/03/2018 10:18 AM by [name], MD    [facility] Prenatal Labs  Dating 6 week [place] Blood type: O positive  Genetic Screen 1 Screen:negative  :       Antibody:negative  Anatomic [place] Incomplete 4 chamber, Female [X]  follow up 24 weeks complete Rubella: Immune Varicella: Immune  GTT  RPR: NR  Rhogam N/A HBsAg: Negative  TDaP vaccine 08/03/2018  Flu Shot: had this season HIV: Negative  Baby Food                                GBS:   Contraception  Pap: ASCUS HPV positive 02/13/18  CBB     CS/VBAC N/A   Support Person            Hemorrhage affecting first pregnancy 02/13/2018 by Will Bonnet, MD No       Gestational age appropriate obstetric precautions including but not limited to vaginal bleeding, contractions, leaking of fluid and fetal movement were reviewed in detail with the patient.    - Trial of compazine for headaches - discussed magnesium - TDAP today  Return in about 2 weeks (around 08/17/2018) for ROB.  [name], MD, [facility], [facility] 08/03/2018, 10:19 AM

## 2018-08-03 NOTE — Progress Notes (Signed)
ROB TDAP Migraine

## 2018-08-03 NOTE — Addendum Note (Signed)
Addended by: [place], Corban Kistler B on: 08/03/2018 10:44 AM   Modules accepted: Orders

## 2018-08-08 ENCOUNTER — Other Ambulatory Visit: Payer: Self-pay | Admitting: Advanced Practice Midwife

## 2018-08-08 DIAGNOSIS — R519 Headache, unspecified: Secondary | ICD-10-CM

## 2018-08-08 DIAGNOSIS — O26899 Other specified pregnancy related conditions, unspecified trimester: Secondary | ICD-10-CM

## 2018-08-08 DIAGNOSIS — R51 Headache: Principal | ICD-10-CM

## 2018-08-08 NOTE — Telephone Encounter (Signed)
I have never seen this patient, and I am deferring to you. [employer]

## 2018-08-08 NOTE — Telephone Encounter (Signed)
advise

## 2018-08-10 ENCOUNTER — Other Ambulatory Visit: Payer: Self-pay

## 2018-08-10 DIAGNOSIS — O26899 Other specified pregnancy related conditions, unspecified trimester: Secondary | ICD-10-CM

## 2018-08-10 DIAGNOSIS — R519 Headache, unspecified: Secondary | ICD-10-CM

## 2018-08-10 DIAGNOSIS — R51 Headache: Principal | ICD-10-CM

## 2018-08-11 MED ORDER — BUTALBITAL-APAP-CAFFEINE 50-325-40 MG PO TABS: ORAL_TABLET | ORAL | 0 refills | Status: DC

## 2018-08-14 ENCOUNTER — Telehealth: Payer: Self-pay

## 2018-08-14 NOTE — Telephone Encounter (Signed)
If its just cramping it is unlikely to be contractions.  We do not have the ability to monitor for contractions which would necessitate she go to the hospital.  So unless they are stronger than cramping or have a clear pattern I would not recommend she go to the hospital.

## 2018-08-14 NOTE — Telephone Encounter (Signed)
Pt called and advised on what Dr.Staebler said. She will be going to [facility] because she is concerned about how they are coming about every 10 to 20 mins and they are in her back and stomach area. Reassured pt to try and stay calm and lay on left side when possible and drink plenty of water.

## 2018-08-14 NOTE — Telephone Encounter (Signed)
Having menstrual type cramps sense yesterday, feels like she is having contractions and not braxton hicks. Just wants some reassurance that she is not in preterm labor, doubled her water intake and she still seems extremely thirsty. Please advise. Thank you.

## 2018-08-15 ENCOUNTER — Observation Stay
Admission: EM | Admit: 2018-08-15 | Discharge: 2018-08-15 | Disposition: A | Discharge status: Home / Self Care | Attending: Obstetrics and Gynecology | Admitting: Obstetrics and Gynecology

## 2018-08-15 ENCOUNTER — Telehealth: Payer: Self-pay

## 2018-08-15 ENCOUNTER — Other Ambulatory Visit: Payer: Self-pay

## 2018-08-15 DIAGNOSIS — O4703 False labor before 37 completed weeks of gestation, third trimester: Secondary | ICD-10-CM | POA: Diagnosis not present

## 2018-08-15 DIAGNOSIS — Z3A32 32 weeks gestation of pregnancy: Secondary | ICD-10-CM | POA: Diagnosis not present

## 2018-08-15 DIAGNOSIS — Z3490 Encounter for supervision of normal pregnancy, unspecified, unspecified trimester: Secondary | ICD-10-CM

## 2018-08-15 DIAGNOSIS — O4690 Antepartum hemorrhage, unspecified, unspecified trimester: Secondary | ICD-10-CM

## 2018-08-15 LAB — URINALYSIS, COMPLETE (UACMP) WITH MICROSCOPIC
Bacteria, UA: NONE SEEN
Bilirubin Urine: NEGATIVE
Glucose, UA: NEGATIVE mg/dL
HGB URINE DIPSTICK: NEGATIVE
Ketones, ur: NEGATIVE mg/dL
LEUKOCYTE UA: NEGATIVE
Nitrite: NEGATIVE
Protein, ur: NEGATIVE mg/dL
Specific Gravity, Urine: 1.015 (ref 1.005–1.030)
pH: 7.5 (ref 5.0–8.0)

## 2018-08-15 MED ORDER — ACETAMINOPHEN 325 MG PO TABS: 650.0000 mg | ORAL_TABLET | ORAL | Status: DC | PRN

## 2018-08-15 NOTE — Telephone Encounter (Signed)
Pt called triage asking to speak with me today because she is concerned about these sharp pains she has been experiencing on her left and right side that seem to be constant along with the cramping that is in her stomach and lower back. I have advised pt to go to the ER sense Georgianne Fick is there and knows her story. Pt did not go last night when advised to go if she continued to feel bad.

## 2018-08-15 NOTE — OB Triage Note (Signed)
Pt here with complaint of left and right sided abdominal pain, had 2 days of cramping, now pain alternates from left to right, as well as middle. Reports positive fetal movement, denies bleeding, LOF

## 2018-08-15 NOTE — Discharge Summary (Signed)
Physician Final Progress Note  Patient ID: Vickie Manning MRN: [id number] DOB/AGE: 27-13-1992 27 y.o.  Admit date: 08/15/2018 Admitting provider: [name], MD Discharge date: 08/15/2018   Admission Diagnoses: Contractions  Discharge Diagnoses:  Active Problems:   Indication for care in labor and delivery, antepartum  28 y.o. G1P0000 at [redacted]w[redacted]d presenting with sharp positional groin pain and contractions.  No LOF, no VB.  Pregnancy otherwise uncomplicated to day.  Observed with no evidence of regular contractions. Negative UA.  Pregnancy #1 Problems (from 12/17/17 to present)    Problem Noted Resolved   Encounter for supervision of normal pregnancy, antepartum 03/08/2018 by [name], MD No   Overview Addendum 08/03/2018 10:18 AM by [name], MD    [facility] Prenatal Labs  Dating 6 week [place] Blood type: O positive  Genetic Screen 1 Screen:negative  :       Antibody:negative  Anatomic [place] Incomplete 4 chamber, Female [X]  follow up 24 weeks complete Rubella: Immune Varicella: Immune  GTT  RPR: NR  Rhogam N/A HBsAg: Negative  TDaP vaccine 08/03/2018  Flu Shot: had this season HIV: Negative  Baby Food                                GBS:   Contraception  Pap: ASCUS HPV positive 02/13/18  CBB     CS/VBAC N/A   Support Person            Hemorrhage affecting first pregnancy 02/13/2018 by Will Bonnet, MD No      Consults: None  Significant Findings/ Diagnostic Studies:  Results for orders placed or performed during the hospital encounter of 08/15/18 (from the past 24 hour(s))  Urinalysis, Complete w Microscopic     Status: None   Collection Time: 08/15/18 12:22 PM  Result Value Ref Range   Color, Urine YELLOW YELLOW   APPearance CLEAR CLEAR   Specific Gravity, Urine 1.015 1.005 - 1.030   pH 7.5 5.0 - 8.0   Glucose, UA NEGATIVE NEGATIVE mg/dL   Hgb urine dipstick NEGATIVE NEGATIVE   Bilirubin Urine NEGATIVE NEGATIVE   Ketones, ur NEGATIVE NEGATIVE  mg/dL   Protein, ur NEGATIVE NEGATIVE mg/dL   Nitrite NEGATIVE NEGATIVE   Leukocytes,Ua NEGATIVE NEGATIVE   Squamous Epithelial / LPF 0-5 0 - 5   WBC, UA 0-5 0 - 5 WBC/hpf   RBC / HPF 0-5 0 - 5 RBC/hpf   Bacteria, UA NONE SEEN NONE SEEN     Procedures:  Baseline: 145 Variability: moderate Accelerations: present Decelerations: absent Tocometry: irregular, every 10-12 minutes The patient was monitored for >30 minutes, fetal heart rate tracing was deemed reactive, category I tracing,  Discharge Condition: good  Disposition: Discharge disposition: 01-Home or Self Care       Diet: Regular diet  Discharge Activity: Activity as tolerated  Discharge Instructions    Discharge activity:  No Restrictions   Complete by:  As directed    Discharge diet:  No restrictions   Complete by:  As directed    No sexual activity restrictions   Complete by:  As directed    Notify physician for a general feeling that "something is not right"   Complete by:  As directed    Notify physician for increase or change in vaginal discharge   Complete by:  As directed    Notify physician for intestinal cramps, with or without diarrhea, sometimes described as "gas pain"  Complete by:  As directed    Notify physician for leaking of fluid   Complete by:  As directed    Notify physician for low, dull backache, unrelieved by heat or Tylenol   Complete by:  As directed    Notify physician for menstrual like cramps   Complete by:  As directed    Notify physician for pelvic pressure   Complete by:  As directed    Notify physician for uterine contractions.  These may be painless and feel like the uterus is tightening or the baby is  "balling up"   Complete by:  As directed    Notify physician for vaginal bleeding   Complete by:  As directed    PRETERM LABOR:  Includes any of the follwing symptoms that occur between 20 - [redacted] weeks gestation.  If these symptoms are not stopped, preterm labor can result in  preterm delivery, placing your baby at risk   Complete by:  As directed      Allergies as of 08/15/2018   No Known Allergies     Medication List    STOP taking these medications   butalbital-acetaminophen-caffeine 50-325-40 MG tablet Commonly known as:  FIORICET, ESGIC     TAKE these medications   Concept OB 130-92.4-1 MG Caps Take 1 capsule by mouth daily.   Magnesium 100 MG Caps Take by mouth.   prochlorperazine 10 MG tablet Commonly known as:  COMPAZINE Take 1 tablet (10 mg total) by mouth every 6 (six) hours as needed for nausea or vomiting (or headache).   sertraline 50 MG tablet Commonly known as:  Zoloft Take 1 tablet (50 mg total) by mouth daily.        Total time spent taking care of this patient: 30 minutes  Signed: [name] 08/15/2018, 1:33 PM

## 2018-08-16 LAB — URINE CULTURE: Culture: NO GROWTH

## 2018-08-17 ENCOUNTER — Other Ambulatory Visit: Payer: Self-pay

## 2018-08-17 ENCOUNTER — Ambulatory Visit (INDEPENDENT_AMBULATORY_CARE_PROVIDER_SITE_OTHER): Admitting: Obstetrics and Gynecology

## 2018-08-17 VITALS — BP 112/66 | Wt 138.0 lb

## 2018-08-17 DIAGNOSIS — Z3490 Encounter for supervision of normal pregnancy, unspecified, unspecified trimester: Secondary | ICD-10-CM

## 2018-08-17 DIAGNOSIS — Z3493 Encounter for supervision of normal pregnancy, unspecified, third trimester: Secondary | ICD-10-CM

## 2018-08-17 DIAGNOSIS — Z3A32 32 weeks gestation of pregnancy: Secondary | ICD-10-CM

## 2018-08-17 LAB — POCT URINALYSIS DIPSTICK OB
Glucose, UA: NEGATIVE
POC,PROTEIN,UA: NEGATIVE

## 2018-08-17 NOTE — Progress Notes (Signed)
ROB

## 2018-08-17 NOTE — Progress Notes (Signed)
    Routine Prenatal Care Visit  Subjective  Vickie Manning is a 28 y.o. G1P0000 at [redacted]w[redacted]d being seen today for ongoing prenatal care.  She is currently monitored for the following issues for this low-risk pregnancy and has Hemorrhage affecting first pregnancy; Encounter for supervision of normal pregnancy, antepartum; Anxiety disorder; Hemorrhoids; and Indication for care in labor and delivery, antepartum on their problem list.  ----------------------------------------------------------------------------------- Patient reports no complaints.   Contractions: Irregular. Vag. Bleeding: None.  Movement: Present. Denies leaking of fluid.  ----------------------------------------------------------------------------------- The following portions of the patient's history were reviewed and updated as appropriate: allergies, current medications, past family history, past medical history, past social history, past surgical history and problem list. Problem list updated.   Objective  Blood pressure 112/66, weight 138 lb (62.6 kg), last menstrual period 12/17/2017. Pregravid weight 105 lb (47.6 kg) Total Weight Gain 33 lb (15 kg) Urinalysis:      Fetal Status: Fetal Heart Rate (bpm): 145 Fundal Height: 31 cm Movement: Present     General:  Alert, oriented and cooperative. Patient is in no acute distress.  Skin: Skin is warm and dry. No rash noted.   Cardiovascular: Normal heart rate noted  Respiratory: Normal respiratory effort, no problems with respiration noted  Abdomen: Soft, gravid, appropriate for gestational age. Pain/Pressure: Present     Pelvic:  Cervical exam deferred        Extremities: Normal range of motion.     ental Status: Normal mood and affect. Normal behavior. Normal judgment and thought content.     Assessment   28 y.o. G1P0000 at [redacted]w[redacted]d by  10/08/2018, by Ultrasound presenting for routine prenatal visit  Plan   Pregnancy #1 Problems (from 12/17/17 to present)    Problem  Noted Resolved   Encounter for supervision of normal pregnancy, antepartum 03/08/2018 by [name], MD No   Overview Addendum 08/17/2018  9:14 AM by [name], MD    [facility] Prenatal Labs  Dating 6 week [place] Blood type: O positive  Genetic Screen 1 Screen:negative  :       Antibody:negative  Anatomic [place] Incomplete 4 chamber, Female [X]  follow up 24 weeks complete Rubella: Immune Varicella: Immune  GTT  RPR: NR  Rhogam N/A HBsAg: Negative  TDaP vaccine 08/03/2018  Flu Shot: had this season HIV: Negative  Baby Food                                GBS:   Contraception  Pap: ASCUS HPV positive 02/13/18  CBB     CS/VBAC N/A   Support Person RJ           Hemorrhage affecting first pregnancy 02/13/2018 by Will Bonnet, MD No       Gestational age appropriate obstetric precautions including but not limited to vaginal bleeding, contractions, leaking of fluid and fetal movement were reviewed in detail with the patient.    Return in about 4 weeks (around 09/14/2018) for 2 weeks telophone visit, 4 weeks rob.  [name], MD, [facility], [facility] 08/17/2018, 9:17 AM

## 2018-08-30 ENCOUNTER — Ambulatory Visit (INDEPENDENT_AMBULATORY_CARE_PROVIDER_SITE_OTHER): Admitting: Obstetrics and Gynecology

## 2018-08-30 ENCOUNTER — Other Ambulatory Visit: Payer: Self-pay

## 2018-08-30 DIAGNOSIS — Z3A34 34 weeks gestation of pregnancy: Secondary | ICD-10-CM

## 2018-08-30 DIAGNOSIS — Z3403 Encounter for supervision of normal first pregnancy, third trimester: Secondary | ICD-10-CM

## 2018-08-30 DIAGNOSIS — Z3490 Encounter for supervision of normal pregnancy, unspecified, unspecified trimester: Secondary | ICD-10-CM

## 2018-08-30 NOTE — Progress Notes (Signed)
ROB Feels like braxton hicks are more intense Feet and legs are tingling/white splotchy dots Yesterday disoriented

## 2018-08-30 NOTE — Progress Notes (Signed)
    Routine Prenatal Care Visit  Subjective  Vickie Manning is a 28 y.o. G1P0000 at [redacted]w[redacted]d being seen today for ongoing prenatal care.  She is currently monitored for the following issues for this low-risk pregnancy and has Encounter for supervision of normal pregnancy, antepartum; Anxiety disorder; Hemorrhoids; and Indication for care in labor and delivery, antepartum on their problem list.  ----------------------------------------------------------------------------------- Patient reports some lower extremity swelling and some sciaticy  .  .   . Denies leaking of fluid.  ----------------------------------------------------------------------------------- The following portions of the patient's history were reviewed and updated as appropriate: allergies, current medications, past family history, past medical history, past social history, past surgical history and problem list. Problem list updated.   Objective  Last menstrual period 12/17/2017. Pregravid weight 105 lb (47.6 kg) Total Weight Gain 33 lb (15 kg) Urinalysis:      Fetal Status:           No physical exam phone visit to promote physical distancing during current COVID 19 Pandemic  Assessment   28 y.o. G1P0000 at [redacted]w[redacted]d by  10/08/2018, by Ultrasound presenting for routine prenatal visit  Plan   Pregnancy #1 Problems (from 12/17/17 to present)    Problem Noted Resolved   Encounter for supervision of normal pregnancy, antepartum 03/08/2018 by [name], MD No   Overview Addendum 08/17/2018  9:14 AM by [name], MD    [facility] Prenatal Labs  Dating 6 week [place] Blood type: O positive  Genetic Screen 1 Screen:negative  :       Antibody:negative  Anatomic [place] Incomplete 4 chamber, Female [X]  follow up 24 weeks complete Rubella: Immune Varicella: Immune  GTT  RPR: NR  Rhogam N/A HBsAg: Negative  TDaP vaccine 08/03/2018  Flu Shot: had this season HIV: Negative  Baby Food                                GBS:    Contraception  Pap: ASCUS HPV positive 02/13/18  CBB     CS/VBAC N/A   Support Person RJ               Gestational age appropriate obstetric precautions including but not limited to vaginal bleeding, contractions, leaking of fluid and fetal movement were reviewed in detail with the patient.    Return in about 2 years (around 08/29/2020) for [place].  [name], MD, [facility], [facility] 08/30/2018, 11:22 AM

## 2018-09-04 ENCOUNTER — Telehealth: Payer: Self-pay

## 2018-09-04 NOTE — Telephone Encounter (Signed)
Pt called the after hour nurse c/o thinks she may have lost her mucus plug 09/01/18 am. States she started with some increased d/c that morning.  Not mucusy but more creamy and is not 100% sure.  Wants to know what to do.  States she had a sharp pain that am as well and then afterwards just her normal cramping and contractions that AMS already knows about.  After hour nurse adv pt to keep genital area clean and dry, wear cotton underwear or underwear c cotton crotch, do not douche or use feminine hygiene products, avoid prolonged wearing of pantyhose, tight jeans, avoid bubble baths and tampons, call back if d/c becomes yellow or green or foul smelling or itchy, fever, vag bleeding or abd pain.  [phone]  P/c to pt revealed she is doing well today.  Adv losing mucus plug doesn't mean immediate labor and it looks like thick snot.

## 2018-09-13 ENCOUNTER — Other Ambulatory Visit: Payer: Self-pay

## 2018-09-13 ENCOUNTER — Ambulatory Visit (INDEPENDENT_AMBULATORY_CARE_PROVIDER_SITE_OTHER): Admitting: Obstetrics and Gynecology

## 2018-09-13 VITALS — BP 106/62 | Wt 141.0 lb

## 2018-09-13 DIAGNOSIS — Z3403 Encounter for supervision of normal first pregnancy, third trimester: Secondary | ICD-10-CM

## 2018-09-13 DIAGNOSIS — Z3A36 36 weeks gestation of pregnancy: Secondary | ICD-10-CM

## 2018-09-13 DIAGNOSIS — Z3490 Encounter for supervision of normal pregnancy, unspecified, unspecified trimester: Secondary | ICD-10-CM

## 2018-09-13 DIAGNOSIS — Z3685 Encounter for antenatal screening for Streptococcus B: Secondary | ICD-10-CM

## 2018-09-13 LAB — POCT URINALYSIS DIPSTICK OB
Glucose, UA: NEGATIVE
POC,PROTEIN,UA: NEGATIVE

## 2018-09-13 NOTE — Progress Notes (Signed)
ROB Contractions GBS

## 2018-09-13 NOTE — Progress Notes (Signed)
    Routine Prenatal Care Visit  Subjective  Vickie Manning is a 28 y.o. G1P0000 at [redacted]w[redacted]d being seen today for ongoing prenatal care.  She is currently monitored for the following issues for this low-risk pregnancy and has Encounter for supervision of normal pregnancy, antepartum; Anxiety disorder; and Hemorrhoids on their problem list.  ----------------------------------------------------------------------------------- Patient reports no complaints.   Contractions: Irregular. Vag. Bleeding: None.  Movement: Present. Denies leaking of fluid.  ----------------------------------------------------------------------------------- The following portions of the patient's history were reviewed and updated as appropriate: allergies, current medications, past family history, past medical history, past social history, past surgical history and problem list. Problem list updated.   Objective  Blood pressure 106/62, weight 141 lb (64 kg), last menstrual period 12/17/2017. Pregravid weight 105 lb (47.6 kg) Total Weight Gain 36 lb (16.3 kg) Urinalysis:      Fetal Status: Fetal Heart Rate (bpm): 140 Fundal Height: 35 cm Movement: Present  Presentation: Vertex  General:  Alert, oriented and cooperative. Patient is in no acute distress.  Skin: Skin is warm and dry. No rash noted.   Cardiovascular: Normal heart rate noted  Respiratory: Normal respiratory effort, no problems with respiration noted  Abdomen: Soft, gravid, appropriate for gestational age. Pain/Pressure: Present     Pelvic:  Cervical exam performed Dilation: Fingertip Effacement (%): 30 Station: -3  Extremities: Normal range of motion.     ental Status: Normal mood and affect. Normal behavior. Normal judgment and thought content.     Assessment   28 y.o. G1P0000 at [redacted]w[redacted]d by  10/08/2018, by Ultrasound presenting for routine prenatal visit  Plan   Pregnancy #1 Problems (from 12/17/17 to present)    Problem Noted Resolved   Encounter for  supervision of normal pregnancy, antepartum 03/08/2018 by [name], MD No   Overview Addendum 08/17/2018  9:14 AM by [name], MD    Clinic [facility] Prenatal Labs  Dating 6 week [place] Blood type: O positive  Genetic Screen 1 Screen:negative  :       Antibody:negative  Anatomic [place] Incomplete 4 chamber, Female [X]  follow up 24 weeks complete Rubella: Immune Varicella: Immune  GTT  RPR: NR  Rhogam N/A HBsAg: Negative  TDaP vaccine 08/03/2018  Flu Shot: had this season HIV: Negative  Baby Food                                GBS:   Contraception  Pap: ASCUS HPV positive 02/13/18  CBB     CS/VBAC N/A   Support Person RJ               Gestational age appropriate obstetric precautions including but not limited to vaginal bleeding, contractions, leaking of fluid and fetal movement were reviewed in detail with the patient.    GBS collected  Return in about 1 week (around 09/20/2018) for ROB.  [name], MD, [facility], [facility] 09/13/2018, 9:08 AM

## 2018-09-14 ENCOUNTER — Encounter: Admitting: Obstetrics and Gynecology

## 2018-09-15 ENCOUNTER — Other Ambulatory Visit: Payer: Self-pay | Admitting: Obstetrics and Gynecology

## 2018-09-15 LAB — STREP GP B NAA: Strep Gp B NAA: NEGATIVE

## 2018-09-15 MED ORDER — VALACYCLOVIR HCL 1 G PO TABS: 1000.0000 mg | ORAL_TABLET | Freq: Every day | ORAL | 1 refills | Status: DC

## 2018-09-20 ENCOUNTER — Other Ambulatory Visit: Payer: Self-pay

## 2018-09-20 ENCOUNTER — Ambulatory Visit (INDEPENDENT_AMBULATORY_CARE_PROVIDER_SITE_OTHER): Admitting: Obstetrics and Gynecology

## 2018-09-20 VITALS — BP 100/66 | Wt 143.0 lb

## 2018-09-20 DIAGNOSIS — Z3A37 37 weeks gestation of pregnancy: Secondary | ICD-10-CM

## 2018-09-20 DIAGNOSIS — Z3490 Encounter for supervision of normal pregnancy, unspecified, unspecified trimester: Secondary | ICD-10-CM

## 2018-09-20 DIAGNOSIS — O9989 Other specified diseases and conditions complicating pregnancy, childbirth and the puerperium: Secondary | ICD-10-CM

## 2018-09-20 DIAGNOSIS — B373 Candidiasis of vulva and vagina: Secondary | ICD-10-CM

## 2018-09-20 DIAGNOSIS — B3731 Acute candidiasis of vulva and vagina: Secondary | ICD-10-CM

## 2018-09-20 LAB — POCT URINALYSIS DIPSTICK OB: POC,PROTEIN,UA: NEGATIVE

## 2018-09-20 MED ORDER — FLUCONAZOLE 150 MG PO TABS: 150.0000 mg | ORAL_TABLET | Freq: Once | ORAL | 0 refills | Status: AC

## 2018-09-20 NOTE — Progress Notes (Signed)
ROB Vaginal irritation/redness Hot flash/righthand numbness

## 2018-09-20 NOTE — Addendum Note (Signed)
Addended by: [place], Millisa Giarrusso B on: 09/20/2018 09:04 AM   Modules accepted: Orders

## 2018-09-20 NOTE — Progress Notes (Signed)
    Routine Prenatal Care Visit  Subjective  Vickie Manning is a 28 y.o. G1P0000 at [redacted]w[redacted]d being seen today for ongoing prenatal care.  She is currently monitored for the following issues for this low-risk pregnancy and has Encounter for supervision of normal pregnancy, antepartum; Anxiety disorder; and Hemorrhoids on their problem list.  ----------------------------------------------------------------------------------- Patient reports no complaints.   Contractions: Irregular. Vag. Bleeding: None.  Movement: Present. Denies leaking of fluid.  ----------------------------------------------------------------------------------- The following portions of the patient's history were reviewed and updated as appropriate: allergies, current medications, past family history, past medical history, past social history, past surgical history and problem list. Problem list updated.   Objective  Blood pressure 100/66, weight 143 lb (64.9 kg), last menstrual period 12/17/2017. Pregravid weight 105 lb (47.6 kg) Total Weight Gain 38 lb (17.2 kg) Urinalysis:      Fetal Status: Fetal Heart Rate (bpm): 155 Fundal Height: 36 cm Movement: Present  Presentation: Vertex  General:  Alert, oriented and cooperative. Patient is in no acute distress.  Skin: Skin is warm and dry. No rash noted.   Cardiovascular: Normal heart rate noted  Respiratory: Normal respiratory effort, no problems with respiration noted  Abdomen: Soft, gravid, appropriate for gestational age. Pain/Pressure: Present     Pelvic:  Cervical exam deferred Dilation: Fingertip Effacement (%): 30 Station: -3  Extremities: Normal range of motion.     ental Status: Normal mood and affect. Normal behavior. Normal judgment and thought content.     Assessment   28 y.o. G1P0000 at [redacted]w[redacted]d by  10/08/2018, by Ultrasound presenting for routine prenatal visit  Plan   Pregnancy #1 Problems (from 12/17/17 to present)    Problem Noted Resolved   Encounter for  supervision of normal pregnancy, antepartum 03/08/2018 by [name], MD No   Overview Addendum 08/17/2018  9:14 AM by [name], MD    [facility]inic [facility] Prenatal Labs  Dating 6 week [place] Blood type: O positive  Genetic Screen 1 Screen:negative  :       Antibody:negative  Anatomic [place] Incomplete 4 chamber, Female [X]  follow up 24 weeks complete Rubella: Immune Varicella: Immune  GTT  RPR: NR  Rhogam N/A HBsAg: Negative  TDaP vaccine 08/03/2018  Flu Shot: had this season HIV: Negative  Baby Food                                GBS:   Contraception  Pap: ASCUS HPV positive 02/13/18  CBB     CS/VBAC N/A   Support Person RJ               Gestational age appropriate obstetric precautions including but not limited to vaginal bleeding, contractions, leaking of fluid and fetal movement were reviewed in detail with the patient.    Return in about 1 week (around 09/27/2018) for ROB.  [name], MD, [facility], [facility] 09/20/2018, 8:58 AM

## 2018-09-21 NOTE — Telephone Encounter (Signed)
Spoke with patient and answered questions.

## 2018-09-26 ENCOUNTER — Other Ambulatory Visit: Payer: Self-pay

## 2018-09-26 ENCOUNTER — Ambulatory Visit (INDEPENDENT_AMBULATORY_CARE_PROVIDER_SITE_OTHER): Admitting: Obstetrics and Gynecology

## 2018-09-26 VITALS — BP 108/72 | Wt 144.0 lb

## 2018-09-26 DIAGNOSIS — Z3403 Encounter for supervision of normal first pregnancy, third trimester: Secondary | ICD-10-CM

## 2018-09-26 DIAGNOSIS — Z3A38 38 weeks gestation of pregnancy: Secondary | ICD-10-CM

## 2018-09-26 DIAGNOSIS — Z3490 Encounter for supervision of normal pregnancy, unspecified, unspecified trimester: Secondary | ICD-10-CM

## 2018-09-26 LAB — POCT URINALYSIS DIPSTICK OB: POC,PROTEIN,UA: NEGATIVE

## 2018-09-26 NOTE — Progress Notes (Signed)
ROB

## 2018-09-26 NOTE — Progress Notes (Signed)
    Routine Prenatal Care Visit  Subjective  Vickie Manning is a 28 y.o. G1P0000 at [redacted]w[redacted]d being seen today for ongoing prenatal care.  She is currently monitored for the following issues for this low-risk pregnancy and has Encounter for supervision of normal pregnancy, antepartum; Anxiety disorder; and Hemorrhoids on their problem list.  ----------------------------------------------------------------------------------- Patient reports no complaints.   Contractions: Irregular. Vag. Bleeding: None.  Movement: Present. Denies leaking of fluid.  ----------------------------------------------------------------------------------- The following portions of the patient's history were reviewed and updated as appropriate: allergies, current medications, past family history, past medical history, past social history, past surgical history and problem list. Problem list updated.   Objective  Blood pressure 108/72, weight 144 lb (65.3 kg), last menstrual period 12/17/2017. Pregravid weight 105 lb (47.6 kg) Total Weight Gain 39 lb (17.7 kg) Urinalysis:      Fetal Status: Fetal Heart Rate (bpm): 150 Fundal Height: 37 cm Movement: Present  Presentation: Vertex  General:  Alert, oriented and cooperative. Patient is in no acute distress.  Skin: Skin is warm and dry. No rash noted.   Cardiovascular: Normal heart rate noted  Respiratory: Normal respiratory effort, no problems with respiration noted  Abdomen: Soft, gravid, appropriate for gestational age. Pain/Pressure: Present     Pelvic:  Cervical exam performed Dilation: Fingertip Effacement (%): 30 Station: -3  Extremities: Normal range of motion.     ental Status: Normal mood and affect. Normal behavior. Normal judgment and thought content.     Assessment   28 y.o. G1P0000 at [redacted]w[redacted]d by  10/08/2018, by Ultrasound presenting for routine prenatal visit  Plan   Pregnancy #1 Problems (from 12/17/17 to present)    Problem Noted Resolved   Encounter  for supervision of normal pregnancy, antepartum 03/08/2018 by [name], MD No   Overview Addendum 08/17/2018  9:14 AM by [name], MD    [facility]inic [facility] Prenatal Labs  Dating 6 week [place] Blood type: O positive  Genetic Screen 1 Screen:negative  :       Antibody:negative  Anatomic [place] Incomplete 4 chamber, Female [X]  follow up 24 weeks complete Rubella: Immune Varicella: Immune  GTT  RPR: NR  Rhogam N/A HBsAg: Negative  TDaP vaccine 08/03/2018  Flu Shot: had this season HIV: Negative  Baby Food                                GBS:   Contraception  Pap: ASCUS HPV positive 02/13/18  CBB     CS/VBAC N/A   Support Person RJ               Gestational age appropriate obstetric precautions including but not limited to vaginal bleeding, contractions, leaking of fluid and fetal movement were reviewed in detail with the patient.    - IOL scheduled for 10/05/2018  Return in about 1 week (around 10/03/2018) for ROB.  [name], MD, [facility], [facility] 09/26/2018, 4:39 PM

## 2018-10-03 ENCOUNTER — Other Ambulatory Visit: Payer: Self-pay | Admitting: Obstetrics and Gynecology

## 2018-10-03 ENCOUNTER — Other Ambulatory Visit: Payer: Self-pay

## 2018-10-03 ENCOUNTER — Ambulatory Visit
Admission: RE | Admit: 2018-10-03 | Discharge: 2018-10-03 | Disposition: A | Discharge status: Home / Self Care | Source: Ambulatory Visit | Attending: Obstetrics and Gynecology | Admitting: Obstetrics and Gynecology

## 2018-10-03 DIAGNOSIS — Z1159 Encounter for screening for other viral diseases: Secondary | ICD-10-CM | POA: Insufficient documentation

## 2018-10-03 DIAGNOSIS — Z3490 Encounter for supervision of normal pregnancy, unspecified, unspecified trimester: Secondary | ICD-10-CM

## 2018-10-04 ENCOUNTER — Ambulatory Visit (INDEPENDENT_AMBULATORY_CARE_PROVIDER_SITE_OTHER): Admitting: Obstetrics and Gynecology

## 2018-10-04 ENCOUNTER — Other Ambulatory Visit: Payer: Self-pay

## 2018-10-04 DIAGNOSIS — Z3403 Encounter for supervision of normal first pregnancy, third trimester: Secondary | ICD-10-CM

## 2018-10-04 DIAGNOSIS — Z3490 Encounter for supervision of normal pregnancy, unspecified, unspecified trimester: Secondary | ICD-10-CM

## 2018-10-04 DIAGNOSIS — Z3A39 39 weeks gestation of pregnancy: Secondary | ICD-10-CM

## 2018-10-04 LAB — NOVEL CORONAVIRUS, NAA (HOSP ORDER, SEND-OUT TO REF LAB; TAT 18-24 HRS): SARS-CoV-2, NAA: NOT DETECTED

## 2018-10-04 NOTE — Progress Notes (Signed)
Obstetric H&P   Chief Complaint: [place]  Prenatal Care Provider: [facility]  History of Present Illness: 28 y.o. G1P0000 27w3dby 10/08/2018, by Ultrasound presenting to schedule IOL.  No LOF, no VB, no ctx.  Pregnancy uncomplicated to date   Pregravid weight 105 lb (47.6 kg) Total Weight Gain 40 lb (18.1 kg)  Pregnancy #1 Problems (from 12/17/17 to present)    Problem Noted Resolved   Encounter for supervision of normal pregnancy, antepartum 03/08/2018 by SMalachy Mood MD No   Overview Addendum 08/17/2018  9:14 AM by SMalachy Mood MD    [facility] Prenatal Labs  Dating 6 week [place] type: O positive  Genetic Screen 1 Screen:negative  :       Antibody:negative  Anatomic [place] 4 chamber, Female [X]  follow up 24 weeks complete Rubella: Immune Varicella: Immune  GTT 110 RPR: NR  Rhogam N/A HBsAg: Negative  TDaP vaccine 08/03/2018  Flu Shot: had this season HIV: Negative  Baby Food                                GBS: negative  Contraception  Pap: ASCUS HPV positive 02/13/18  CBB     CS/VBAC N/A   Support Person RJ               Review of Systems: 10 point review of systems negative unless otherwise noted in HPI  Past Medical History: Past Medical History:  Diagnosis Date  . Anxiety   . Depression     Past Surgical History: Past Surgical History:  Procedure Laterality Date  . APPENDECTOMY    . WISDOM TOOTH EXTRACTION      Past Obstetric History: #: 1, Date: None, Sex: None, Weight: None, GA: None, Delivery: None, Apgar1: None, Apgar5: None, Living: None, Birth Comments: None   Past Gynecologic History:  Family History: Family History  Problem Relation Age of Onset  . Endometriosis Sister     Social History: Social History   Socioeconomic History  . Marital status: Married    Spouse name: Not on file  . Number of children: Not on file  . Years of education: Not on file  . Highest education level: Some college, no degree  Occupational  History  . Not on file  Social Needs  . Financial resource strain: Not hard at all  . Food insecurity:    Worry: Never true    Inability: Never true  . Transportation needs:    Medical: No    Non-medical: No  Tobacco Use  . Smoking status: Never Smoker  . Smokeless tobacco: Never Used  Substance and Sexual Activity  . Alcohol use: Never    Frequency: Never  . Drug use: Never  . Sexual activity: Yes  Lifestyle  . Physical activity:    Days per week: 0 days    Minutes per session: 0 min  . Stress: To some extent  Relationships  . Social connections:    Talks on phone: More than three times a week    Gets together: Once a week    Attends religious service: More than 4 times per year    Active member of club or organization: No    Attends meetings of clubs or organizations: Never    Relationship status: Married  . Intimate partner violence:    Fear of current or ex partner: No    Emotionally abused: No    Physically  abused: No    Forced sexual activity: No  Other Topics Concern  . Not on file  Social History Narrative  . Not on file    Medications: Prior to Admission medications   Medication Sig Start Date End Date Taking? Authorizing Provider  Prenat w/o A Vit-FeFum-FePo-FA (CONCEPT OB) 130-92.4-1 MG CAPS Take 1 capsule by mouth daily. 02/10/18  Yes [name], MD  valACYclovir (VALTREX) 1000 MG tablet Take 1 tablet (1,000 mg total) by mouth daily. 09/15/18  Yes [name], MD  Magnesium 100 MG CAPS Take by mouth.    [provider]    Allergies: No Known Allergies  Physical Exam: Vitals: Blood pressure (!) 98/52, weight 145 lb (65.8 kg), last menstrual period 12/17/2017.  Urine Dip Protein: neg  FHT: 140  General: NAD HEENT: normocephalic, anicteric Pulmonary: No increased work of breathing Cardiovascular: RRR, distal pulses 2+ Abdomen: Gravid, non-tender [place]: vtx 7lbs Genitourinary: ftp/30/-2 Extremities: no edema, erythema,  or tenderness Neurologic: Grossly intact Psychiatric: mood appropriate, affect full  Labs: No results found for this or any previous visit (from the past 24 hour(s)).  Assessment: 28 y.o. G1P0000 27w3dby 10/08/2018, by Ultrasound presenting for ROB and to schedule IOL  Plan: 1) IOL - The ARRIVE study was a national multicenter trial that randomized 6,106 patients to induction of labor at 39 weeks 0 days to 39 weeks 4 days (3,062) compared to expectant management (3,044).  There was no significant difference in adverse perinatal outcomes but there was a significantly lower rate of cesarean delivery, as well as lower rate of maternal hypertensive complications in the induction group.  Number to treat was calculated as 28 induction of labor to prevent 1 primary Cesarean section.  "Labor Induction versus Expectant Management in Nulliparous Low-Risk Women" The NDoverof Medicine iAugust 9, 2018 Vol. 379 No. 6   2) Fetus - +FHT documented today  3) PNL - Blood type O/Positive/-- (10/11 1440) / Anti-bodyscreen Negative (10/11 1440) / Rubella 2.01 (10/11 1440) / Varicella Immune / RPR Non Reactive (02/26 1000) / HBsAg Negative (10/11 1440) / HIV Non Reactive (02/26 1000) / 1-hr OGTT 110 / GBS Negative (04/22 1156)  4) Immunization History -  Immunization History  Administered Date(s) Administered  . DTaP 10/30/1991, 01/12/1995, 12/04/1996  . Hepatitis B 0Dec 11, 1993 10/30/1991, 01/12/1995  . HiB (PRP-OMP) 11/29/1991, 01/12/1995  . Hpv 06/04/2010, 07/30/2010, 12/03/2010  . IPV 01/12/1995, 12/04/1996  . Influenza,inj,Quad PF,6+ Mos 03/17/2018  . Influenza-Unspecified 06/04/2010  . MMR 12/04/1996  . Tdap 06/04/2010, 08/03/2018    5) Disposition -   AMalachy Mood MD, [place]5/13/2020, 11:33 AM

## 2018-10-04 NOTE — Progress Notes (Signed)
ROB

## 2018-10-04 NOTE — Progress Notes (Signed)
  [place] [facility] INDUCTION ASSESSMENT SCHEDULING Vickie Manning August 23, 1990 Medical record #: [id number] Phone #:  Home Phone [phone]  Mobile [phone]    Prenatal Provider:[facility] Delivering Group[facility] Proposed admission date/time: 10/05/2018 Method of induction:Cytotec  Weight: Filed Weights05/13/20 1111Weight:145 lb (65.8 kg) BMI Body mass index is 23.4 kg/m. HIV Negative HSV Negative EDC Estimated Date of Delivery: 5/17/20based on:LMP  Gestational age on admission: [redacted]w[redacted]d Gravidity/parity:G1P0000  Cervix Score   0 1 2 3   Position Posterior Midposition Anterior   Consistency Firm Medium Soft   Effacement (%) 0-30 40-50 60-70 >80  Dilation (cm) Closed 1-2 3-4 >5  Baby's station -3 -2 -1 +1, +2   Bishop Score:3   Medical induction of labor  select indication(s) below Elective induction ?39 weeks multiparous patient ?39 weeks primiparous patient with Bishop score ?7 ?40 weeks primiparous patient   Medical Indications Adapted from [place] #[id number], "Medically Indicated Late Preterm and Early Term Deliveries," 2013.  PLACENTAL / UTERINE ISSUES FETAL ISSUES MATERNAL ISSUES  ? Placenta previa (36.0-37.6) ? Isoimmunization (37.0-38.6) ? Preeclampsia without severe features or gestational HTN (37.0)  ? Suspected accreta (34.0-35.6) ? Growth Restriction [name]) ? Preeclampsia with severe features (34.0)  ? Prior classical CD, uterine window, rupture (36.0-37.6) ? Isolated (38.0-39.6) ? Chronic HTN (38.0-39.6)  ? Prior myomectomy (37.0-38.6) ? Concurrent findings (34.0-37.6) ? Cholestasis (37.0)  ? Umbilical vein varix (75.1) ? Growth Restriction (Twins) ? Diabetes  ? Placental abruption (chronic) ? Di-Di Isolated (36.0-37.6) ? Pregestational, controlled (39.0)  OBSTETRIC ISSUES ? Di-Di concurrent findings (32.0-34.6) ? Pregestational, uncontrolled (37.0-39.0)  ? Postdates ? (41 weeks) ? Mo-Di isolated (32.0-34.6) ? Pregestational, vascular  compromise (37.0- 39.0)  ? PPROM (34.0) ? Multiple Gestation ? Gestational, diet controlled (40.0)  ? Hx of IUFD (39.0 weeks) ? Di-Di (38.0-38.6) ? Gestational, med controlled (39.0)  ? Polyhydramnios, mild/moderate; SDV 8-16 or AFI 25-35 (39.0) ? Mo-Di (36.0-37.6) ? Gestational, uncontrolled (38.0-39.0)  ? Oligohydramnios (36.0-37.6); MVP <2 cm  For indications not listed above, delivery recommendations from maternal-fetal medicine consultant occurred on: N/A  Provider Signature: [name]  Date:10/04/2018 11:33 AM   Call [phone] to finalize the induction date/time  [id number] (07/17)

## 2018-10-05 ENCOUNTER — Inpatient Hospital Stay
Admission: AD | Admit: 2018-10-05 | Discharge: 2018-10-08 | DRG: 807 | Disposition: A | Discharge status: Home / Self Care | Attending: Obstetrics and Gynecology | Admitting: Obstetrics and Gynecology

## 2018-10-05 ENCOUNTER — Other Ambulatory Visit: Payer: Self-pay

## 2018-10-05 DIAGNOSIS — O26893 Other specified pregnancy related conditions, third trimester: Secondary | ICD-10-CM | POA: Diagnosis present

## 2018-10-05 DIAGNOSIS — Z3490 Encounter for supervision of normal pregnancy, unspecified, unspecified trimester: Secondary | ICD-10-CM | POA: Diagnosis present

## 2018-10-05 DIAGNOSIS — Z1159 Encounter for screening for other viral diseases: Secondary | ICD-10-CM | POA: Insufficient documentation

## 2018-10-05 DIAGNOSIS — Z3A39 39 weeks gestation of pregnancy: Secondary | ICD-10-CM

## 2018-10-05 LAB — CBC
HCT: 36.1 % (ref 36.0–46.0)
Hemoglobin: 12.7 g/dL (ref 12.0–15.0)
MCH: 32.8 pg (ref 26.0–34.0)
MCHC: 35.2 g/dL (ref 30.0–36.0)
MCV: 93.3 fL (ref 80.0–100.0)
Platelets: 299 10*3/uL (ref 150–400)
RBC: 3.87 MIL/uL (ref 3.87–5.11)
RDW: 13.7 % (ref 11.5–15.5)
WBC: 9.1 10*3/uL (ref 4.0–10.5)
nRBC: 0 % (ref 0.0–0.2)

## 2018-10-05 LAB — TYPE AND SCREEN
ABO/RH(D): O POS
Antibody Screen: NEGATIVE

## 2018-10-05 MED ORDER — OXYTOCIN 40 UNITS IN NORMAL SALINE INFUSION - SIMPLE MED
2.5000 [IU]/h | INTRAVENOUS | Status: DC
  Filled 2018-10-05 (×2): qty 1000

## 2018-10-05 MED ORDER — LACTATED RINGERS IV SOLN: 500.0000 mL | INTRAVENOUS | Status: DC | PRN

## 2018-10-05 MED ORDER — BISACODYL 10 MG RE SUPP
10.0000 mg | Freq: Once | RECTAL | Status: AC
  Administered 2018-10-05: 10 mg via RECTAL
  Filled 2018-10-05: qty 1

## 2018-10-05 MED ORDER — BUTORPHANOL TARTRATE 2 MG/ML IJ SOLN
1.0000 mg | INTRAMUSCULAR | Status: DC | PRN
  Administered 2018-10-05 – 2018-10-06 (×6): 1 mg via INTRAVENOUS
  Filled 2018-10-05 (×5): qty 1

## 2018-10-05 MED ORDER — TERBUTALINE SULFATE 1 MG/ML IJ SOLN: 0.2500 mg | Freq: Once | INTRAMUSCULAR | Status: DC | PRN

## 2018-10-05 MED ORDER — MISOPROSTOL 25 MCG QUARTER TABLET
25.0000 ug | ORAL_TABLET | ORAL | Status: DC | PRN
  Administered 2018-10-05 (×3): 25 ug via VAGINAL
  Filled 2018-10-05 (×3): qty 1

## 2018-10-05 MED ORDER — OXYTOCIN BOLUS FROM INFUSION
500.0000 mL | Freq: Once | INTRAVENOUS | Status: AC
  Administered 2018-10-06: 23:00:00 500 mL via INTRAVENOUS

## 2018-10-05 MED ORDER — OXYTOCIN 40 UNITS IN NORMAL SALINE INFUSION - SIMPLE MED
1.0000 m[IU]/min | INTRAVENOUS | Status: DC
  Administered 2018-10-05: 2 m[IU]/min via INTRAVENOUS
  Administered 2018-10-06: 20:00:00 19 m[IU]/min via INTRAVENOUS

## 2018-10-05 MED ORDER — LACTATED RINGERS IV SOLN
INTRAVENOUS | Status: DC
  Administered 2018-10-05 – 2018-10-06 (×5): via INTRAVENOUS

## 2018-10-05 MED ORDER — SOD CITRATE-CITRIC ACID 500-334 MG/5ML PO SOLN: 30.0000 mL | ORAL | Status: DC | PRN

## 2018-10-05 MED ORDER — LIDOCAINE HCL (PF) 1 % IJ SOLN: 30.0000 mL | INTRAMUSCULAR | Status: DC | PRN

## 2018-10-05 MED ORDER — ACETAMINOPHEN 325 MG PO TABS: 650.0000 mg | ORAL_TABLET | ORAL | Status: DC | PRN

## 2018-10-05 MED ORDER — ONDANSETRON HCL 4 MG/2ML IJ SOLN
4.0000 mg | Freq: Four times a day (QID) | INTRAMUSCULAR | Status: DC | PRN
  Administered 2018-10-06: 18:00:00 4 mg via INTRAVENOUS
  Filled 2018-10-05: qty 2

## 2018-10-05 NOTE — Progress Notes (Signed)
  Labor Progress Note   28 y.o. G1P0000 @ [redacted]w[redacted]d , admitted for  Pregnancy, Labor Management.   Subjective:  Some pain w increasing level of ctxs  Objective:  BP 119/82 (BP Location: Left Arm)   Pulse 77   Temp (!) 97.3 F (36.3 C) (Oral)   Resp 16   Ht 5\' 6"  (1.676 m)   Wt 66.2 kg   LMP 12/17/2017 (Exact Date)   BMI 23.57 kg/m  Abd: gravid, ND, FHT present, moderate tenderness on exam Extr: trace to 1+ bilateral pedal edema SVE: CERVIX: 1-2 cm dilated, 70 effaced, -3 station  EFM: FHR: 140 bpm, variability: moderate,  accelerations:  Present,  decelerations:  Absent Toco: Frequency: Every 2-3 minutes Labs: I have reviewed the patient's lab results.   Assessment & Plan:  G1P0000 @ [redacted]w[redacted]d, admitted for  Pregnancy and Labor/Delivery Management  1. Pain management: none. 2. FWB: FHT category 1.  3. ID: GBS negative 4. Labor management: Change to Pitocin after 3 doses Cytotec utilized AROM when progresses more Epidural when ready  All discussed with patient, see orders  Barnett Applebaum, MD, [facility], [facility] 10/05/2018  9:43 PM

## 2018-10-05 NOTE — H&P (Signed)
History and Physical Interval Note:  10/05/2018 9:17 AM  Vickie Manning  has presented today for INDUCTION OF LABOR (cervical ripening agents),  with the diagnosis of elective induction at 39 4/7 weeks. The various methods of treatment have been discussed with the patient and family. After consideration of risks, benefits and other options for treatment, the patient has consented to  Labor induction .  The patient's history has been reviewed, patient examined, no change in status, and is stable for induction as planned.  See H&P. I have reviewed the patient's chart and labs.  Questions were answered to the patient's satisfaction.    Cytotec placed now, by self.    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

## 2018-10-06 ENCOUNTER — Inpatient Hospital Stay: Admitting: Anesthesiology

## 2018-10-06 DIAGNOSIS — Z3A39 39 weeks gestation of pregnancy: Secondary | ICD-10-CM

## 2018-10-06 LAB — RPR: RPR Ser Ql: NONREACTIVE

## 2018-10-06 MED ORDER — FENTANYL 2.5 MCG/ML W/ROPIVACAINE 0.15% IN NS 100 ML EPIDURAL (ARMC)
12.0000 mL/h | EPIDURAL | Status: DC
  Administered 2018-10-06: 18:00:00 12 mL/h via EPIDURAL

## 2018-10-06 MED ORDER — PHENYLEPHRINE 40 MCG/ML (10ML) SYRINGE FOR IV PUSH (FOR BLOOD PRESSURE SUPPORT): 80.0000 ug | PREFILLED_SYRINGE | INTRAVENOUS | Status: DC | PRN

## 2018-10-06 MED ORDER — DIPHENHYDRAMINE HCL 50 MG/ML IJ SOLN: 12.5000 mg | INTRAMUSCULAR | Status: DC | PRN

## 2018-10-06 MED ORDER — EPHEDRINE 5 MG/ML INJ: 10.0000 mg | INTRAVENOUS | Status: DC | PRN

## 2018-10-06 MED ORDER — LIDOCAINE HCL (PF) 1 % IJ SOLN
INTRAMUSCULAR | Status: DC | PRN
  Administered 2018-10-06: 4 mL via INTRADERMAL

## 2018-10-06 MED ORDER — LIDOCAINE-EPINEPHRINE (PF) 1.5 %-1:200000 IJ SOLN
INTRAMUSCULAR | Status: DC | PRN
  Administered 2018-10-06: 4 mL via PERINEURAL

## 2018-10-06 MED ORDER — BUPIVACAINE HCL (PF) 0.25 % IJ SOLN
INTRAMUSCULAR | Status: DC | PRN
  Administered 2018-10-06 (×2): 4 mL via EPIDURAL

## 2018-10-06 MED ORDER — FENTANYL 2.5 MCG/ML W/ROPIVACAINE 0.15% IN NS 100 ML EPIDURAL (ARMC)
EPIDURAL | Status: AC
  Filled 2018-10-06: qty 100

## 2018-10-06 MED ORDER — LACTATED RINGERS IV SOLN
500.0000 mL | Freq: Once | INTRAVENOUS | Status: AC
  Administered 2018-10-06: 500 mL via INTRAVENOUS

## 2018-10-06 NOTE — Anesthesia Procedure Notes (Signed)
Epidural Patient location during procedure: OB  Staffing Performed: anesthesiologist   Preanesthetic Checklist Completed: patient identified, site marked, surgical consent, pre-op evaluation, timeout performed, IV checked, risks and benefits discussed and monitors and equipment checked  Epidural Patient position: sitting Prep: Betadine Patient monitoring: heart rate, continuous pulse ox and blood pressure Approach: midline Location: L4-L5 Injection technique: LOR saline  Needle:  Needle type: Tuohy  Needle gauge: 17 G Needle length: 9 cm and 9 Needle insertion depth: 6 cm Catheter type: closed end flexible Catheter size: 19 Gauge Catheter at skin depth: 11 cm Test dose: negative and 1.5% lidocaine with Epi 1:200 K  Assessment Sensory level: T10 Events: blood not aspirated, injection not painful, no injection resistance, negative IV test and no paresthesia  Additional Notes   Patient tolerated the insertion well without complications.-SATD -IVTD. No paresthesia. Refer to OBI[facility] nursing for VS and dosingReason for block:procedure for pain     

## 2018-10-06 NOTE — Progress Notes (Signed)
  Labor Progress Note   28 y.o. G1P0000 @ [redacted]w[redacted]d , admitted for  Pregnancy, Labor Management. Induction of labor at term.  Subjective:  Feeling contractions, but not as intense as before.   Objective:  BP 108/70 (BP Location: Left Arm)   Pulse 94   Temp 97.9 F (36.6 C) (Oral)   Resp 18   Ht 5\' 6"  (1.676 m)   Wt 66.2 kg   LMP 12/17/2017 (Exact Date)   BMI 23.57 kg/m  Abd: gravid, non-tender Extr: trace to 1+ bilateral pedal edema SVE: 2/80/-2, Foley bulb placed, minimal bloody show  EFM: FHR: 145 bpm, variability: moderate,  accelerations:  Present,  decelerations:  Absent Toco: Frequency: Every 3-3.5 minutes, Duration: 70-80 seconds and Intensity: mild Labs: I have reviewed the patient's lab results.   Assessment & Plan:  G1P0000 @ [redacted]w[redacted]d, admitted for  Pregnancy and Labor/Delivery Management  1. Pain management: IV sedation, plans epidural. 2. FWB: FHT category I.  3. ID: GBS negative 4. Labor management: Foley bulb in place, continue to titrate Pitocin.  Avel Sensor, CNM 10/06/2018

## 2018-10-06 NOTE — Discharge Summary (Signed)
Obstetric Discharge Summary Reason for Admission: induction of labor Prenatal Procedures: none Intrapartum Procedures: spontaneous vaginal delivery Postpartum Procedures: none Complications-Operative and Postpartum: none Hemoglobin  Date Value Ref Range Status  10/07/2018 11.6 (L) 12.0 - 15.0 g/dL Final  07/19/2018 12.4 11.1 - 15.9 g/dL Final   HCT  Date Value Ref Range Status  10/07/2018 34.0 (L) 36.0 - 46.0 % Final   Hematocrit  Date Value Ref Range Status  07/19/2018 35.2 34.0 - 46.6 % Final    Physical Exam:  General: alert, cooperative and appears stated age 28: appropriate Uterine Fundus: firm Incision: none DVT Evaluation: No evidence of DVT seen on physical exam.  Discharge Diagnoses: Term Pregnancy-delivered  Discharge Information: Date: 10/08/2018 Activity: pelvic rest Diet: routine Allergies as of 10/08/2018   No Known Allergies     Medication List    TAKE these medications   acetaminophen 325 MG tablet Commonly known as:  Tylenol Take 2 tablets (650 mg total) by mouth every 4 (four) hours as needed.   Concept OB 130-92.4-1 MG Caps Take 1 capsule by mouth daily.   docusate sodium 100 MG capsule Commonly known as:  Colace Take 1 capsule (100 mg total) by mouth daily as needed.   ibuprofen 600 MG tablet Commonly known as:  ADVIL Take 1 tablet (600 mg total) by mouth every 6 (six) hours.   Magnesium 100 MG Caps Take by mouth.   oxyCODONE-acetaminophen 5-325 MG tablet Commonly known as:  PERCOCET/ROXICET Take 1 tablet by mouth every 4 (four) hours as needed (pain scale 4-7).   valACYclovir 1000 MG tablet Commonly known as:  VALTREX Take 1 tablet (1,000 mg total) by mouth daily.       Condition: stable Discharge to: home Follow-up Information    [name], MD Follow up in 6 week(s).   Specialty:  Obstetrics and Gynecology Why:  postpartum Contact information: [address] [place] [id number] [phone]            Newborn Data: Live born female  Birth Weight:   APGAR: 60, 9  Newborn Delivery   Birth date/time:  10/06/2018 22:45:00 Delivery type:  Vaginal, Spontaneous     Home with mother.  Christanna R Schuman 10/08/2018, 9:50 AM

## 2018-10-06 NOTE — Anesthesia Preprocedure Evaluation (Signed)
Anesthesia Evaluation  Patient identified by MRN, date of birth, ID band Patient awake    Reviewed: Allergy & Precautions, H&P , NPO status , Patient's Chart, lab work & pertinent test results, reviewed documented beta blocker date and time   Airway Mallampati: II  TM Distance: >3 FB Neck ROM: full    Dental no notable dental hx. (+) Teeth Intact   Pulmonary neg pulmonary ROS, Current Smoker,    Pulmonary exam normal breath sounds clear to auscultation       Cardiovascular Exercise Tolerance: Good negative cardio ROS   Rhythm:regular Rate:Normal     Neuro/Psych negative neurological ROS  negative psych ROS   GI/Hepatic negative GI ROS, Neg liver ROS,   Endo/Other  negative endocrine ROSdiabetes  Renal/GU      Musculoskeletal   Abdominal   Peds  Hematology negative hematology ROS (+)   Anesthesia Other Findings   Reproductive/Obstetrics (+) Pregnancy                             Anesthesia Physical Anesthesia Plan  ASA: II  Anesthesia Plan: Epidural   Post-op Pain Management:    Induction:   PONV Risk Score and Plan:   Airway Management Planned:   Additional Equipment:   Intra-op Plan:   Post-operative Plan:   Informed Consent: I have reviewed the patients History and Physical, chart, labs and discussed the procedure including the risks, benefits and alternatives for the proposed anesthesia with the patient or authorized representative who has indicated his/her understanding and acceptance.       Plan Discussed with:   Anesthesia Plan Comments:         Anesthesia Quick Evaluation

## 2018-10-06 NOTE — Progress Notes (Signed)
   Subjective:  Comfortable with epidural in place  Objective:   Vitals: Blood pressure 111/76, pulse (!) 155, temperature 98.1 F (36.7 C), temperature source Oral, resp. rate 18, height 5\' 6"  (1.676 m), weight 66.2 kg, last menstrual period 12/17/2017, SpO2 (!) 89 %. General: NAD Abdomen: gravid, non-tender Cervical Exam:  Dilation: 3 Effacement (%): 70 Cervical Position: Middle Station: -2 Presentation: Vertex Exam by:: Georgianne Fick, MD  FHT:  135, moderate, +accels, early decelerations Toco: q11min  No results found for this or any previous visit (from the past 24 hour(s)).  Assessment:   28 y.o. G1P0000 [redacted]w[redacted]d IOL elective  Plan:   1) Labor - continue pitocin  2) Fetus - cat I tracing  [name], MD, [place], [facility] 10/06/2018, 7:53 PM

## 2018-10-06 NOTE — Progress Notes (Signed)
  Labor Progress Note   28 y.o. G1P0000 @ [redacted]w[redacted]d , admitted for  Pregnancy, Labor Management.   Subjective:  IOL on Pitocin overnight after some cervical change, min pain; fatigue  Objective:  BP 108/70 (BP Location: Left Arm)   Pulse 94   Temp 97.6 F (36.4 C) (Axillary)   Resp 18   Ht 5\' 6"  (1.676 m)   Wt 66.2 kg   LMP 12/17/2017 (Exact Date)   BMI 23.57 kg/m  Abd: gravid, ND, FHT present, mild tenderness on exam Extr: trace to 1+ bilateral pedal edema SVE: CERVIX: no change  EFM: FHR: 140 bpm, variability: moderate,  accelerations:  Present,  decelerations:  Absent Toco: Frequency: irreg Labs: I have reviewed the patient's lab results.   Assessment & Plan:  G1P0000 @ [redacted]w[redacted]d, admitted for  Pregnancy and Labor/Delivery Management  1. Pain management: none. 2. FWB: FHT category 1.  3. ID: GBS negative 4. Labor management: No change this am Will give brief pitocin break and allow to eat and restart Pitocin  All discussed with patient, see orders  Barnett Applebaum, MD, [facility], [facility] 10/06/2018  7:18 AM

## 2018-10-07 LAB — CBC
HCT: 34 % — ABNORMAL LOW (ref 36.0–46.0)
Hemoglobin: 11.6 g/dL — ABNORMAL LOW (ref 12.0–15.0)
MCH: 32 pg (ref 26.0–34.0)
MCHC: 34.1 g/dL (ref 30.0–36.0)
MCV: 93.9 fL (ref 80.0–100.0)
Platelets: 186 10*3/uL (ref 150–400)
RBC: 3.62 MIL/uL — ABNORMAL LOW (ref 3.87–5.11)
RDW: 13.5 % (ref 11.5–15.5)
WBC: 14.7 10*3/uL — ABNORMAL HIGH (ref 4.0–10.5)
nRBC: 0 % (ref 0.0–0.2)

## 2018-10-07 MED ORDER — COCONUT OIL OIL: 1.0000 "application " | TOPICAL_OIL | Status: DC | PRN

## 2018-10-07 MED ORDER — ONDANSETRON HCL 4 MG/2ML IJ SOLN: 4.0000 mg | INTRAMUSCULAR | Status: DC | PRN

## 2018-10-07 MED ORDER — ONDANSETRON HCL 4 MG PO TABS: 4.0000 mg | ORAL_TABLET | ORAL | Status: DC | PRN

## 2018-10-07 MED ORDER — WITCH HAZEL-GLYCERIN EX PADS
1.0000 "application " | MEDICATED_PAD | CUTANEOUS | Status: DC | PRN
  Administered 2018-10-07: 1 via TOPICAL
  Filled 2018-10-07: qty 100

## 2018-10-07 MED ORDER — OXYCODONE-ACETAMINOPHEN 5-325 MG PO TABS
1.0000 | ORAL_TABLET | ORAL | Status: DC | PRN
  Administered 2018-10-07: 08:00:00 1 via ORAL

## 2018-10-07 MED ORDER — DIPHENHYDRAMINE HCL 25 MG PO CAPS: 25.0000 mg | ORAL_CAPSULE | Freq: Four times a day (QID) | ORAL | Status: DC | PRN

## 2018-10-07 MED ORDER — OXYTOCIN 40 UNITS IN NORMAL SALINE INFUSION - SIMPLE MED
INTRAVENOUS | Status: AC
  Filled 2018-10-07: qty 1000

## 2018-10-07 MED ORDER — SENNOSIDES-DOCUSATE SODIUM 8.6-50 MG PO TABS
2.0000 | ORAL_TABLET | ORAL | Status: DC
  Administered 2018-10-07: 21:00:00 2 via ORAL
  Filled 2018-10-07: qty 2

## 2018-10-07 MED ORDER — SIMETHICONE 80 MG PO CHEW: 80.0000 mg | CHEWABLE_TABLET | ORAL | Status: DC | PRN

## 2018-10-07 MED ORDER — OXYCODONE-ACETAMINOPHEN 5-325 MG PO TABS
2.0000 | ORAL_TABLET | ORAL | Status: DC | PRN
  Administered 2018-10-07 – 2018-10-08 (×3): 2 via ORAL
  Filled 2018-10-07 (×4): qty 2

## 2018-10-07 MED ORDER — DIBUCAINE (PERIANAL) 1 % EX OINT
1.0000 "application " | TOPICAL_OINTMENT | CUTANEOUS | Status: DC | PRN
  Administered 2018-10-07: 1 via RECTAL
  Filled 2018-10-07: qty 28

## 2018-10-07 MED ORDER — IBUPROFEN 600 MG PO TABS
600.0000 mg | ORAL_TABLET | Freq: Four times a day (QID) | ORAL | Status: DC
  Administered 2018-10-07 – 2018-10-08 (×6): 600 mg via ORAL
  Filled 2018-10-07 (×6): qty 1

## 2018-10-07 MED ORDER — BENZOCAINE-MENTHOL 20-0.5 % EX AERO
1.0000 "application " | INHALATION_SPRAY | CUTANEOUS | Status: DC | PRN
  Administered 2018-10-07: 1 via TOPICAL
  Filled 2018-10-07: qty 56

## 2018-10-07 MED ORDER — ACETAMINOPHEN 325 MG PO TABS
650.0000 mg | ORAL_TABLET | ORAL | Status: DC | PRN
  Administered 2018-10-07: 650 mg via ORAL
  Filled 2018-10-07: qty 2

## 2018-10-07 MED ORDER — PRENATAL MULTIVITAMIN CH
1.0000 | ORAL_TABLET | Freq: Every day | ORAL | Status: DC
  Administered 2018-10-07: 15:00:00 1 via ORAL
  Filled 2018-10-07: qty 1

## 2018-10-07 NOTE — Anesthesia Postprocedure Evaluation (Signed)
Anesthesia Post Note  Patient: Vickie Manning  Procedure(s) Performed: AN AD [place]  Patient location during evaluation: Mother Baby Anesthesia Type: Epidural Level of consciousness: awake and alert Pain management: pain level controlled Respiratory status: spontaneous breathing and nonlabored ventilation Cardiovascular status: stable Postop Assessment: no apparent nausea or vomiting, spinal receding and no headache Anesthetic complications: no     Last Vitals:  Vitals:   10/07/18 0359 10/07/18 0739  BP: 106/72 107/77  Pulse: 91 63  Resp: 18 17  Temp: 36.9 C 36.6 C  SpO2: 97% 99%    Last Pain:  Vitals:   10/07/18 0925  TempSrc:   PainSc: [place]

## 2018-10-07 NOTE — Progress Notes (Signed)
Husband called RN to patient room, stating patient is "sitting on the toilet feeling really bad." He also states patient is "pouring blood." Upon RN arrival, patient is sitting on the toilet shaking and states she feels nauseous. Patient also states she tried to stool and although her anus hurts, she didn't have to strain to stool. Small amount of blood on pad and in toilet. Bleeding WDL and presents no cause for concern at this time. Patient states she feels she has less bleeding now than earlier in the day. RN ambulated patient back to bed. Nausea gone. Vitals stable and WDL at this time. Fundus firm, no trickle or heavy bleeding with fundal assessment. Fundus U/E and far right (Provider aware. Dr. Gilman Schmidt updated during rounds). Patient then stated she feels better. RN instructed patient to call before getting out of bed next time and to dangle legs on the side of the bed for a few minutes before getting up. Patient verbalizes understanding. RN to continue to monitor.

## 2018-10-07 NOTE — Lactation Note (Signed)
This note was copied from a baby's chart. Lactation Consultation Note  Patient Name: Vickie Manning Date: 10/07/2018  Vickie Manning has been breast feeding well per parents. Parents had lots of lactation questions which were addressed such as how to know he is getting enough from the breast, when and how to start pumping, foods to avoid, what to put on sore nipples and if need to wash it off.  Reviewed supply and demand, hand expression, normal course of lactation, routine newborn feeding patterns and feeding cues.  Lactation name and number written on white board and encouraged to call with any questions, concerns or assistance.    Maternal Data    Feeding Feeding Type: Breast Fed  LATCH Score                   Interventions    Lactation Tools Discussed/Used     Consult Status      Jarold Motto 10/07/2018, 5:07 PM

## 2018-10-07 NOTE — Progress Notes (Signed)
Patient ID: Vickie Manning, female   DOB: [birth year], 28 y.o.   MRN: [id number]  Admit Date: 10/05/2018 Today's Date: 10/07/2018  Post Partum Day 1  Subjective:  no complaints, up ad lib, voiding, tolerating PO and + flatus  Objective: Temp:  [97.9 F (36.6 C)-100.3 F (37.9 C)] 97.9 F (36.6 C) (05/16 0739) Pulse Rate:  [63-155] 63 (05/16 0739) Resp:  [16-18] 17 (05/16 0739) BP: (106-136)/(57-94) 107/77 (05/16 0739) SpO2:  [79 %-100 %] 99 % (05/16 0739)  Physical Exam:  General: alert and cooperative Lochia: appropriate Uterine Fundus: firm Incision: none DVT Evaluation: No evidence of DVT seen on physical exam.  Recent Labs    10/05/18 0858 10/07/18 0434  HGB 12.7 11.6*  HCT 36.1 34.0*    Assessment/Plan 28 yo G1P1 s/p SVD 1. Rh positive, rubella immune, varicella immune, tdap received 08/03/2018 2.  Pain controlled with oral medications 3. Regular diet 4. Continue supportive care, plan for discharge home tomorrow   LOS: 2 days   [place] 10/07/2018, 10:18 AM

## 2018-10-08 MED ORDER — ACETAMINOPHEN 325 MG PO TABS: 650.0000 mg | ORAL_TABLET | ORAL | 2 refills | Status: DC | PRN

## 2018-10-08 MED ORDER — OXYCODONE-ACETAMINOPHEN 5-325 MG PO TABS: 1.0000 | ORAL_TABLET | ORAL | 0 refills | Status: DC | PRN

## 2018-10-08 MED ORDER — DOCUSATE SODIUM 100 MG PO CAPS: 100.0000 mg | ORAL_CAPSULE | Freq: Every day | ORAL | 2 refills | Status: DC | PRN

## 2018-10-08 MED ORDER — IBUPROFEN 600 MG PO TABS: 600.0000 mg | ORAL_TABLET | Freq: Four times a day (QID) | ORAL | 2 refills | Status: DC

## 2018-10-08 NOTE — Progress Notes (Signed)
Patient ID: Vickie Manning, female   DOB: February 25, 1991, 28 y.o.   MRN: [id number] Admit Date: 10/05/2018 Today's Date: 10/08/2018  Post Partum Day 2  Subjective:  no complaints, up ad lib, voiding, tolerating PO and + flatus  Objective: Temp:  [97.5 F (36.4 C)-98.1 F (36.7 C)] 97.5 F (36.4 C) (05/17 0735) Pulse Rate:  [72-84] 74 (05/17 0735) Resp:  [19-20] 20 (05/17 0735) BP: (96-107)/(74-82) 96/75 (05/17 0735) SpO2:  [97 %-100 %] 98 % (05/17 0735)  Physical Exam:  General: alert and cooperative Lochia: appropriate Uterine Fundus: firm Incision: none DVT Evaluation: No evidence of DVT seen on physical exam.  Recent Labs    10/07/18 0434  HGB 11.6*  HCT 34.0*    Assessment/Plan:  28 yo G1P1 s/p SVD 1. Rh positive, rubella immune, varicella immune, tdap received 08/03/2018 2.  Pain controlled with oral medications 3. Regular diet 4. Considering Depo Provera, POP vs nexplanon for birthcontrol postpartum 5. Breast feeding without issue 6. Discharge home today.   LOS: 3 days   Walls 10/08/2018, 9:43 AM

## 2018-10-08 NOTE — Progress Notes (Signed)
Discharge instructions given and prescriptions sent to pharmacy. Patient verbalizes understanding of teaching. Patient discharged home via wheelchair at 1050.

## 2018-10-08 NOTE — Discharge Instructions (Signed)
Vaginal Delivery, Care After °Refer to this sheet in the next few weeks. These instructions provide you with information about caring for yourself after vaginal delivery. Your health care provider may also give you more specific instructions. Your treatment has been planned according to current medical practices, but problems sometimes occur. Call your health care provider if you have any problems or questions. °What can I expect after the procedure? °After vaginal delivery, it is common to have: °· Some bleeding from your vagina. °· Soreness in your abdomen, your vagina, and the area of skin between your vaginal opening and your anus (perineum). °· Pelvic cramps. °· Fatigue. °Follow these instructions at home: °Medicines °· Take over-the-counter and prescription medicines only as told by your health care provider. °· If you were prescribed an antibiotic medicine, take it as told by your health care provider. Do not stop taking the antibiotic until it is finished. °Driving ° °· Do not drive or operate heavy machinery while taking prescription pain medicine. °· Do not drive for 24 hours if you received a sedative. °Lifestyle °· Do not drink alcohol. This is especially important if you are breastfeeding or taking medicine to relieve pain. °· Do not use tobacco products, including cigarettes, chewing tobacco, or e-cigarettes. If you need help quitting, ask your health care provider. °Eating and drinking °· Drink at least 8 eight-ounce glasses of water every day unless you are told not to by your health care provider. If you choose to breastfeed your baby, you may need to drink more water than this. °· Eat high-fiber foods every day. These foods may help prevent or relieve constipation. High-fiber foods include: °? Whole grain cereals and breads. °? Brown rice. °? Beans. °? Fresh fruits and vegetables. °Activity °· Return to your normal activities as told by your health care provider. Ask your health care provider what  activities are safe for you. °· Rest as much as possible. Try to rest or take a nap when your baby is sleeping. °· Do not lift anything that is heavier than your baby or 10 lb (4.5 kg) until your health care provider says that it is safe. °· Talk with your health care provider about when you can engage in sexual activity. This may depend on your: °? Risk of infection. °? Rate of healing. °? Comfort and desire to engage in sexual activity. °Vaginal Care °· If you have an episiotomy or a vaginal tear, check the area every day for signs of infection. Check for: °? More redness, swelling, or pain. °? More fluid or blood. °? Warmth. °? Pus or a bad smell. °· Do not use tampons or douches until your health care provider says this is safe. °· Watch for any blood clots that may pass from your vagina. These may look like clumps of dark red, brown, or black discharge. °General instructions °· Keep your perineum clean and dry as told by your health care provider. °· Wear loose, comfortable clothing. °· Wipe from front to back when you use the toilet. °· Ask your health care provider if you can shower or take a bath. If you had an episiotomy or a perineal tear during labor and delivery, your health care provider may tell you not to take baths for a certain length of time. °· Wear a bra that supports your breasts and fits you well. °· If possible, have someone help you with household activities and help care for your baby for at least a few days after you   leave the hospital.  Keep all follow-up visits for you and your baby as told by your health care provider. This is important. Contact a health care provider if:  You have: ? Vaginal discharge that has a bad smell. ? Difficulty urinating. ? Pain when urinating. ? A sudden increase or decrease in the frequency of your bowel movements. ? More redness, swelling, or pain around your episiotomy or vaginal tear. ? More fluid or blood coming from your episiotomy or vaginal  tear. ? Pus or a bad smell coming from your episiotomy or vaginal tear. ? A fever. ? A rash. ? Little or no interest in activities you used to enjoy. ? Questions about caring for yourself or your baby.  Your episiotomy or vaginal tear feels warm to the touch.  Your episiotomy or vaginal tear is separating or does not appear to be healing.  Your breasts are painful, hard, or turn red.  You feel unusually sad or worried.  You feel nauseous or you vomit.  You pass large blood clots from your vagina. If you pass a blood clot from your vagina, save it to show to your health care provider. Do not flush blood clots down the toilet without having your health care provider look at them.  You urinate more than usual.  You are dizzy or light-headed.  You have not breastfed at all and you have not had a menstrual period for 12 weeks after delivery.  You have stopped breastfeeding and you have not had a menstrual period for 12 weeks after you stopped breastfeeding. Get help right away if:  You have: ? Pain that does not go away or does not get better with medicine. ? Chest pain. ? Difficulty breathing. ? Blurred vision or spots in your vision. ? Thoughts about hurting yourself or your baby.  You develop pain in your abdomen or in one of your legs.  You develop a severe headache.  You faint.  You bleed from your vagina so much that you fill two sanitary pads in one hour. This information is not intended to replace advice given to you by your health care provider. Make sure you discuss any questions you have with your health care provider. Document Released: 05/07/2000 Document Revised: 10/22/2015 Document Reviewed: 05/25/2015 Elsevier Interactive Patient Education  2019 [employer]. Breastfeeding and Self-Care It is normal to have some problems when you start to breastfeed your new baby. But there are things that you can do to take care of yourself and help prevent many common  problems. This includes keeping your breasts healthy and making sure that your baby's mouth attaches (latches) properly to your nipple for feedings. Work with your doctor or breastfeeding specialist (Science writer) to find what works best for you. Follow these instructions at home: Breastfeeding strategy   Always make sure that your baby latches properly to breastfeed.  Make sure that your baby is in a proper position. Try different breastfeeding positions to find one that works best for you and your baby.  Breastfeed when you feel like you need to make your breasts less full or when your baby shows signs of hunger. This is called "breastfeeding on demand."  Do not delay feedings.  Try to relax when it is time to feed your baby. This helps your body release milk from your breast.  To help increase milk flow: ? Remove a small amount of milk from your breast right before breastfeeding. Do this using a pump or by squeezing with  your hand. ? Apply warm, moist heat to your breast right before feeding. You can do this in the shower or with hand towels soaked with warm water. ? Massage your breast right before or during feeding. Breast care   To help your breasts stay healthy and keep them from getting too dry: ? Avoid using soap on your nipples. ? Let your nipples air-dry for 3-4 minutes after each feeding. ? Use only cotton bra pads to soak up breast milk that leaks. Be sure to change the pads if they become soaked with milk. If you use bra pads that can be thrown away, change them often. ? Put some lanolin on your nipples after breastfeeding. Pure lanolin does not need to be washed off your nipple before you feed your baby again. Pure lanolin is not harmful to your baby. ? Rub some breast milk into your nipples: ? Use your hand to squeeze out a few drops of breast milk. ? Gently massage the milk into your nipples. ? Let your nipples air-dry.  Wear a supportive nursing bra. Avoid  wearing: ? Tight clothing. ? Underwire bras or bras that put pressure on your breasts.  Use ice to help relieve pain or swelling of your breasts: ? Put ice in a plastic bag. ? Place a towel between your skin and the bag. ? Leave the ice on for 20 minutes, 2-3 times a day. General instructions  Drink enough fluid to keep your pee (urine) pale yellow.  Get plenty of rest. Sleep when your baby sleeps.  Talk to your doctor or breastfeeding specialist before taking any herbal supplements. Contact a health care provider if:  You have nipple pain.  You have cracking or soreness in your nipples that lasts longer than 1 week.  Your breasts are overfilled with milk (engorgement) and this lasts longer than 48 hours.  You have a fever.  You have pus-like fluid coming from your nipple.  You have redness, a rash, swelling, itching, or burning on your breast.  Your baby does not gain weight.  Your baby loses weight. Summary  There are things that you can do to take care of yourself and help prevent many common breastfeeding problems.  Always make sure that your baby's mouth attaches (latches) to your nipple properly to breastfeed.  Keep your nipples from getting too dry, drink plenty of fluid, and get plenty of rest.  Feed on demand. Do not delay feedings. This information is not intended to replace advice given to you by your health care provider. Make sure you discuss any questions you have with your health care provider. Document Released: 12/15/2016 Document Revised: 12/15/2016 Document Reviewed: 12/15/2016 Elsevier Interactive Patient Education  2019 [employer]. Breastfeeding and Human Lactation (4th ed., pp. 262-299). [place], [place]: [employer].">  Breastfeeding and Mastitis  Mastitis is inflammation of the breast tissue. It can occur in women who are breastfeeding. This can make breastfeeding painful. Mastitis will sometimes go away on its own, especially if  it is not caused by an infection (non-infectious mastitis). Your health care provider will help determine if medical treatment is needed. Treatment may be needed if the condition is caused by a bacterial infection (infectious mastitis). What are the causes? This condition is often associated with a blocked milkduct, which can happen when too much milk builds up in the breast. Causes of excess milk in the breast can include:  Poor latch-on. If your baby is not latched onto the breast properly, he  or she may not empty your breast completely while breastfeeding.  Allowing too much time to pass between feedings.  Wearing a bra or other clothing that is too tight. This puts extra pressure on the milk ducts so milk does not flow through them as it should.  Milk remaining in the breast because it is overfilled (engorged).  Stress and fatigue. Mastitis can also be caused by a bacterial infection. Bacteria may enter the breast tissue through cuts, cracks, or openings in the skin near the nipple area. Cracks in the skin are often caused when your baby does not latch on properly to the breast. What are the signs or symptoms? Symptoms of this condition include:  Swelling, redness, tenderness, and pain in an area of the breast. This usually affects the upper part of the breast, toward the armpit region. In most cases, it affects only one breast. In some cases, it may occur on both breasts at the same time and affect a larger portion of breast tissue.  Swelling of the glands under the arm on the same side.  Fatigue, headache, and flu-like muscle aches.  Fever.  Rapid pulse. Symptoms usually last 2 to 5 days. Breast pain and redness are at their worst on day 2 and day 3, and they usually go away by day 5. If an infection is left to progress, a collection of pus (abscess) may develop. How is this diagnosed? This condition can be diagnosed based on your symptoms and a physical exam. You may also have  tests, such as:  Blood tests to determine if your body is fighting a bacterial infection.  Mammogram or ultrasound tests to rule out other problems or diseases.  Fluid tests. If an abscess has developed, the fluid in the abscess may be removed with a needle. The fluid may be analyzed to determine if bacteria are present.  Breast milk may be cultured and tested for bacteria. How is this treated? This condition will sometimes go away on its own. Your health care provider may choose to wait 24 hours after first seeing you to decide whether treatment is needed. If treatment is needed, it may include:  Strategies to manage breastfeeding. This includes continuing to breastfeed or pump in order to allow adequate milk flow, using breast massage, and applying heat or cold to the affected area.  Self-care such as rest and increased fluid intake.  Medicine for pain.  Antibiotic medicine to treat a bacterial infection. This is usually taken by mouth.  If an abscess has developed, it may be treated by removing fluid with a needle. Follow these instructions at home: Medicines  Take over-the-counter and prescription medicines only as told by your health care provider.  If you were prescribed an antibiotic medicine, take it as told by your health care provider. Do not stop taking the antibiotic even if you start to feel better. General instructions  Do not wear a tight or underwire bra. Wear a soft, supportive bra.  Increase your fluid intake, especially if you have a fever.  Get plenty of rest. For breastfeeding:  Continue to empty your breasts as often as possible, either by breastfeeding or using an electric breast pump. This will lower the pressure and the pain that comes with it. Ask your health care provider if changes need to be made to your breastfeeding or pumping routine.  Keep your nipples clean and dry.  During breastfeeding, empty the first breast completely before going to the  other breast. If your  baby is not emptying your breasts completely, use a breast pump to empty your breasts.  Use breast massage during feeding or pumping sessions.  If directed, apply moist heat to the affected area of your breast right before breastfeeding or pumping. Use the heat source that your health care provider recommends.  If directed, put ice on the affected area of your breast right after breastfeeding or pumping: ? Put ice in a plastic bag. ? Place a towel between your skin and the bag. ? Leave the ice on for 20 minutes.  If you go back to work, pump your breasts while at work to stay in time with your nursing schedule.  Do not allow your breasts to become engorged. Contact a health care provider if:  You have pus-like discharge from the breast.  You have a fever.  Your symptoms do not improve within 2 days of starting treatment.  Your symptoms return after you have recovered from a breast infection. Get help right away if:  Your pain and swelling are getting worse.  You have pain that is not controlled with medicine.  You have a red line extending from the breast toward your armpit. Summary  Mastitis is inflammation of the breast tissue. It is often caused by a blocked milk duct or bacteria.  This condition may be treated with hot and cold compresses, medicines, self-care, and certain breastfeeding strategies.  If you were prescribed an antibiotic medicine, take it as told by your health care provider. Do not stop taking the antibiotic even if you start to feel better.  Continue to empty your breasts as often as possible either by breastfeeding or using an electric breast pump. This information is not intended to replace advice given to you by your health care provider. Make sure you discuss any questions you have with your health care provider. Document Released: 09/04/2004 Document Revised: 01/27/2018 Document Reviewed: 05/11/2016 Elsevier Interactive Patient  Education  2019 [employer]. Breastfeeding and Low Milk Supply It is normal to have some problems when you start to breastfeed your new baby. One problem is having a low amount of breast milk. If you have a low milk supply, this may cause your baby to not gain enough weight. Making sure your breasts are emptied during feedings can help prevent a low milk supply. Follow these instructions at home: When to breastfeed your baby  Breastfeed when you feel like you need to reduce the fullness of your breasts or when your baby shows signs of hunger. This is called "breastfeeding on demand."  Do not delay feedings. Feed your baby often. General instructions   Try to empty your breasts of milk at each feeding. This will cause them to make more milk.  If your breast is not empty after a feeding, use a pump or squeeze with your hand (hand express) to get the rest of the milk out.  Make sure your baby's mouth attaches to your nipple (latches) properly when breastfeeding.  Make sure your baby is in the right position when breastfeeding. Try different positions to find one that helps your baby feed better.  Do not give your baby extra formula unless your doctor or breastfeeding specialist (lactation consultant) tells you to do that. Medicines  Let your doctor know what over-the-counter or prescription medicines you are taking. Some medicines may affect how much milk you make.  Talk to your doctor or breastfeeding specialist before you take any herbal supplements. Contact a doctor if:  Your baby does  not gain weight.  Your baby loses weight.  You continue to have a low milk supply. Summary  If you have a low milk supply, this may cause your baby to not gain enough weight.  Feed your baby often. Do not delay feedings.  Try to empty your breasts of milk at each feeding. Use a pump or squeeze with your hand (hand express) to get remaining milk out after a feeding. This information is not  intended to replace advice given to you by your health care provider. Make sure you discuss any questions you have with your health care provider. Document Released: 12/15/2016 Document Revised: 12/15/2016 Document Reviewed: 12/15/2016 Elsevier Interactive Patient Education  2019 [employer] Breast Engorgement Breast engorgement is the overfilling of your breasts with breast milk. It is usually caused by delaying feedings, which can cause milk to build up. Breast engorgement can happen at any time while you are breast feeding, and is normal in the first 3-5 days after giving birth. The condition can make your breasts feel heavy, full, hard, tightly stretched, warm, and tender. Breast engorgement should improve within 24-48 hours of feeding your baby or expressing your milk. Follow these instructions at home: When to breastfeed or pump  Breastfeed when your baby shows signs of hunger. This is called "breastfeeding on demand."  Breastfeed or use a breast pump to remove milk from your breasts when you feel the need to reduce the fullness of your breasts.  If your baby is younger than 1 month, make sure you are breastfeeding every 1-3 hours during the day. You may need to wake up your baby to feed if he or she is asleep at a feeding time.  Do not allow your baby to sleep longer than 5 hours during the night without a feeding.  Do not delay feedings.  If you are returning to work or are away from home for an extended period, try to pump your milk on the same schedule as when your baby would breastfeed. Before breastfeeding or pumping:  Increase the circulation in your breasts and help your milk flow. Try either of these methods: ? Taking a warm shower. ? Applying warm, water-soaked hand towels to your breasts. ? Massaging your breasts.  Pump or hand-express breast milk before breastfeeding to soften your breast, areola, and nipple. During breastfeeding or pumping:  Try to relax when it is  time to feed your baby. This helps to trigger your "let-down reflex," which releases milk from your breast.  Ensure your baby is latched on to your breast and positioned properly while breastfeeding.  Empty your breasts completely when breastfeeding or pumping.  Allow your baby to remain at your breast as long as he or she is latched on well and sucking. Your baby will let you know when he or she is done breastfeeding by pulling away from your breast or falling asleep.  Massage your breasts to help your milk flow. Managing pain and swelling   Take over-the-counter and prescription medicines only as told by your health care provider.  If directed, put ice on your breasts: ? Put ice in a plastic bag. ? Place a towel between your skin and the bag. ? Leave the ice on for 20 minutes, 2-3 times a day.  If you feel pain while breastfeeding, take your baby off your breast and try again. General instructions  After breastfeeding or pumping wear a snug bra or tank top for 1-2 days. This will signal your body to  slightly decrease how much milk it makes. Once the engorgement passes, make sure you to wear a well-fitted, supportive bra and regular clothes.  Drink enough fluid to keep your urine clear or pale yellow.  Avoid introducing bottles or pacifiers to your baby in the early weeks of breastfeeding. Wait to introduce these things until after resolving any breastfeeding challenges. Contact a health care provider if:  Engorgement lasts longer than 2 days, even after treatment.  You have flu-like symptoms, such as a fever, chills, or body aches.  You have nausea or you vomit.  Your breasts become red and painful.  You have a lump in your breast.  Your nipples continue to crack or start to ooze.  There is yellow discharge coming from a nipple.  You have pain while breastfeeding, and it does not go away once you take your baby off your breast and try again. Get help right away  if:  There is pus or blood in your breast milk.  You have sudden, severe symptoms.  You have red streaks near your breast.  Both breasts appear infected and you cannot breastfeed. Summary  Breast engorgement is the overfilling of your breasts with breast milk. It is usually caused by delayed feeding.  Although it is normal to experience breast engorgement 3-5 days after giving birth, it can happen at any time while breastfeeding.  Do not delay feedings. Breastfeed on demand to help prevent engorgement.  Increase the circulation in your breasts and help your milk flow before feeding your baby. You can do this by taking a warm shower, applying warm water-soaked hand towels, or massaging your breasts. This information is not intended to replace advice given to you by your health care provider. Make sure you discuss any questions you have with your health care provider. Document Released: 09/04/2004 Document Revised: 06/14/2016 Document Reviewed: 06/14/2016 Elsevier Interactive Patient Education  2019 [employer].

## 2018-10-30 ENCOUNTER — Telehealth: Payer: Self-pay

## 2018-10-30 NOTE — Telephone Encounter (Signed)
Pt called after hour nurse 10/28/18 at 8:35am c/o passed blood clot the size of a golf ball. Was adv normal.  [phone]  [facility] c update.

## 2018-10-31 NOTE — Telephone Encounter (Signed)
Pt states she did not pass a clot all weekend but passed another one last evening.  Adv normal as long as clots didn't come out like pop beads; she doesn't saturate a pad q79min-1hr.  Adv to be seen if saturated a pad q15min-1hr or started running a fever.  Also adv first period may be heavier and that is normal but we still don't want her to saturate a pad q53min-1hr.  Pt voices understanding.

## 2018-11-03 ENCOUNTER — Emergency Department

## 2018-11-03 ENCOUNTER — Observation Stay: Admitting: Certified Registered"

## 2018-11-03 ENCOUNTER — Other Ambulatory Visit: Payer: Self-pay

## 2018-11-03 ENCOUNTER — Observation Stay
Admission: EM | Admit: 2018-11-03 | Discharge: 2018-11-04 | Disposition: A | Discharge status: Home / Self Care | Attending: Obstetrics and Gynecology | Admitting: Obstetrics and Gynecology

## 2018-11-03 ENCOUNTER — Ambulatory Visit (INDEPENDENT_AMBULATORY_CARE_PROVIDER_SITE_OTHER): Admitting: Obstetrics and Gynecology

## 2018-11-03 ENCOUNTER — Encounter: Payer: Self-pay | Admitting: Obstetrics and Gynecology

## 2018-11-03 ENCOUNTER — Encounter: Payer: Self-pay | Admitting: Anesthesiology

## 2018-11-03 ENCOUNTER — Encounter
Admission: EM | Disposition: A | Discharge status: Home / Self Care | Payer: Self-pay | Source: Home / Self Care | Attending: Emergency Medicine

## 2018-11-03 ENCOUNTER — Encounter: Payer: Self-pay | Admitting: Emergency Medicine

## 2018-11-03 DIAGNOSIS — D62 Acute posthemorrhagic anemia: Secondary | ICD-10-CM | POA: Insufficient documentation

## 2018-11-03 DIAGNOSIS — Z79899 Other long term (current) drug therapy: Secondary | ICD-10-CM | POA: Diagnosis not present

## 2018-11-03 DIAGNOSIS — IMO0002 Reserved for concepts with insufficient information to code with codable children: Secondary | ICD-10-CM

## 2018-11-03 DIAGNOSIS — R9389 Abnormal findings on diagnostic imaging of other specified body structures: Secondary | ICD-10-CM | POA: Insufficient documentation

## 2018-11-03 DIAGNOSIS — Z842 Family history of other diseases of the genitourinary system: Secondary | ICD-10-CM | POA: Insufficient documentation

## 2018-11-03 DIAGNOSIS — F419 Anxiety disorder, unspecified: Secondary | ICD-10-CM | POA: Diagnosis not present

## 2018-11-03 DIAGNOSIS — N72 Inflammatory disease of cervix uteri: Secondary | ICD-10-CM | POA: Diagnosis not present

## 2018-11-03 DIAGNOSIS — F329 Major depressive disorder, single episode, unspecified: Secondary | ICD-10-CM | POA: Insufficient documentation

## 2018-11-03 DIAGNOSIS — Z1159 Encounter for screening for other viral diseases: Secondary | ICD-10-CM | POA: Diagnosis not present

## 2018-11-03 DIAGNOSIS — Z791 Long term (current) use of non-steroidal anti-inflammatories (NSAID): Secondary | ICD-10-CM | POA: Diagnosis not present

## 2018-11-03 DIAGNOSIS — N939 Abnormal uterine and vaginal bleeding, unspecified: Secondary | ICD-10-CM

## 2018-11-03 DIAGNOSIS — N719 Inflammatory disease of uterus, unspecified: Secondary | ICD-10-CM | POA: Insufficient documentation

## 2018-11-03 HISTORY — PX: DILATION AND CURETTAGE OF UTERUS: SHX78

## 2018-11-03 LAB — BASIC METABOLIC PANEL
Anion gap: 9 (ref 5–15)
BUN: 16 mg/dL (ref 6–20)
CO2: 25 mmol/L (ref 22–32)
Calcium: 9.2 mg/dL (ref 8.9–10.3)
Chloride: 106 mmol/L (ref 98–111)
Creatinine, Ser: 0.62 mg/dL (ref 0.44–1.00)
GFR calc Af Amer: >60 mL/min (ref >60)
GFR calc non Af Amer: >60 mL/min (ref >60)
Glucose, Bld: 95 mg/dL (ref 70–99)
Potassium: 3.8 mmol/L (ref 3.5–5.1)
Sodium: 140 mmol/L (ref 135–145)

## 2018-11-03 LAB — CBC
HCT: 35.5 % — ABNORMAL LOW (ref 36.0–46.0)
Hemoglobin: 12 g/dL (ref 12.0–15.0)
MCH: 31.6 pg (ref 26.0–34.0)
MCHC: 33.8 g/dL (ref 30.0–36.0)
MCV: 93.4 fL (ref 80.0–100.0)
Platelets: 319 10*3/uL (ref 150–400)
RBC: 3.8 MIL/uL — ABNORMAL LOW (ref 3.87–5.11)
RDW: 12.3 % (ref 11.5–15.5)
WBC: 9.9 10*3/uL (ref 4.0–10.5)
nRBC: 0 % (ref 0.0–0.2)

## 2018-11-03 LAB — SARS CORONAVIRUS 2 BY RT PCR (HOSPITAL ORDER, PERFORMED IN ~~LOC~~ HOSPITAL LAB): SARS Coronavirus 2: NEGATIVE

## 2018-11-03 SURGERY — DILATION AND CURETTAGE
Anesthesia: General

## 2018-11-03 MED ORDER — SODIUM CHLORIDE 0.9 % IV SOLN
INTRAVENOUS | Status: DC | PRN
  Administered 2018-11-03: 23:00:00 via INTRAVENOUS

## 2018-11-03 MED ORDER — MIDAZOLAM HCL 2 MG/2ML IJ SOLN
INTRAMUSCULAR | Status: DC | PRN
  Administered 2018-11-03: 2 mg via INTRAVENOUS

## 2018-11-03 MED ORDER — FENTANYL CITRATE (PF) 100 MCG/2ML IJ SOLN
INTRAMUSCULAR | Status: AC
  Filled 2018-11-03: qty 2

## 2018-11-03 MED ORDER — METHYLERGONOVINE MALEATE 0.2 MG/ML IJ SOLN
INTRAMUSCULAR | Status: DC | PRN
  Administered 2018-11-03: 0.2 mg via INTRAMUSCULAR

## 2018-11-03 MED ORDER — CLINDAMYCIN PHOSPHATE 900 MG/50ML IV SOLN
INTRAVENOUS | Status: AC
  Filled 2018-11-03: qty 50

## 2018-11-03 MED ORDER — METHYLERGONOVINE MALEATE 0.2 MG/ML IJ SOLN
INTRAMUSCULAR | Status: AC
  Filled 2018-11-03: qty 1

## 2018-11-03 MED ORDER — DEXAMETHASONE SODIUM PHOSPHATE 10 MG/ML IJ SOLN
INTRAMUSCULAR | Status: DC | PRN
  Administered 2018-11-03: 5 mg via INTRAVENOUS

## 2018-11-03 MED ORDER — PROPOFOL 10 MG/ML IV BOLUS
INTRAVENOUS | Status: AC
  Filled 2018-11-03: qty 20

## 2018-11-03 MED ORDER — GENTAMICIN SULFATE 40 MG/ML IJ SOLN
5.0000 mg/kg | INTRAVENOUS | Status: AC
  Administered 2018-11-04: 283.6 mg via INTRAVENOUS
  Filled 2018-11-03: qty 7

## 2018-11-03 MED ORDER — MIDAZOLAM HCL 2 MG/2ML IJ SOLN
INTRAMUSCULAR | Status: AC
  Filled 2018-11-03: qty 2

## 2018-11-03 MED ORDER — SODIUM CHLORIDE 0.9% IV SOLUTION
Freq: Once | INTRAVENOUS | Status: DC
  Filled 2018-11-03: qty 250

## 2018-11-03 MED ORDER — CLINDAMYCIN PHOSPHATE 900 MG/50ML IV SOLN: 900.0000 mg | INTRAVENOUS | Status: DC

## 2018-11-03 MED ORDER — PROPOFOL 10 MG/ML IV BOLUS
INTRAVENOUS | Status: AC
  Filled 2018-11-03: qty 40

## 2018-11-03 MED ORDER — CLINDAMYCIN PHOSPHATE 900 MG/50ML IV SOLN
INTRAVENOUS | Status: DC | PRN
  Administered 2018-11-03: 900 mg via INTRAVENOUS

## 2018-11-03 MED ORDER — PROPOFOL 500 MG/50ML IV EMUL
INTRAVENOUS | Status: DC | PRN
  Administered 2018-11-03: 175 ug/kg/min via INTRAVENOUS

## 2018-11-03 MED ORDER — ONDANSETRON HCL 4 MG/2ML IJ SOLN
INTRAMUSCULAR | Status: DC | PRN
  Administered 2018-11-03: 4 mg via INTRAVENOUS

## 2018-11-03 MED ORDER — SODIUM CHLORIDE 0.9 % IV BOLUS
1000.0000 mL | Freq: Once | INTRAVENOUS | Status: AC
  Administered 2018-11-03: 22:00:00 1000 mL via INTRAVENOUS

## 2018-11-03 MED ORDER — FENTANYL CITRATE (PF) 100 MCG/2ML IJ SOLN
INTRAMUSCULAR | Status: DC | PRN
  Administered 2018-11-03: 50 ug via INTRAVENOUS
  Administered 2018-11-03 (×2): 25 ug via INTRAVENOUS
  Administered 2018-11-04: 50 ug via INTRAVENOUS

## 2018-11-03 SURGICAL SUPPLY — 11 items
CATH ROBINSON RED A/P 16FR (CATHETERS) ×2 IMPLANT
COVER WAND RF STERILE (DRAPES) ×2 IMPLANT
GLOVE BIO SURGEON STRL SZ7 (GLOVE) ×2 IMPLANT
GLOVE INDICATOR 7.5 STRL GRN (GLOVE) ×2 IMPLANT
GOWN STRL REUS W/ TWL LRG LVL3 (GOWN DISPOSABLE) ×2 IMPLANT
GOWN STRL REUS W/TWL LRG LVL3 (GOWN DISPOSABLE) ×2
KIT TURNOVER CYSTO (KITS) ×2 IMPLANT
PACK DNC HYST (MISCELLANEOUS) ×2 IMPLANT
PAD OB MATERNITY 4.3X12.25 (PERSONAL CARE ITEMS) ×2 IMPLANT
PAD PREP 24X41 OB/GYN DISP (PERSONAL CARE ITEMS) ×2 IMPLANT
SLEEVE SCD COMPRESS THIGH MED (MISCELLANEOUS) ×2 IMPLANT

## 2018-11-03 NOTE — ED Notes (Signed)
Patient transported to PACU to wait for Covid testing.

## 2018-11-03 NOTE — Anesthesia Post-op Follow-up Note (Signed)
Anesthesia QCDR form completed.        

## 2018-11-03 NOTE — ED Provider Notes (Signed)
[facility] Emergency Department Provider Note  ____________________________________________  Time seen: Approximately 6:49 PM  I have reviewed the triage vital signs and the nursing notes.   HISTORY  Chief Complaint Vaginal Bleeding    HPI Vickie Manning is a 28 y.o. female who presents to the emergency department for treatment and evaluation  of vaginal bleeding.  Patient states that she gave birth approximately 4 weeks ago and has been doing well until the past several days.  She states that she has had some light vaginal bleeding that became a little heavier on August 21, 2022 and Aug 22, 2022 and had passed a couple of clots.  Today, she stood up to pick up the baby to give him a bath and began having gush of vaginal bleeding that lasted approximately 30 to 45 minutes.  She contacted Dr. Georgianne Fick and was able to go to the office for evaluation.  She states that once she got to the office the bleeding had basically stopped.  Dr. Arvilla Meres sent her to the emergency department for ultrasound to make sure that there is no retained products.  Patient states the vaginal bleeding was painless.  She is not experiencing any cramping or abdominal pain now.  Past Medical History:  Diagnosis Date  . Anxiety   . Depression     Patient Active Problem List   Diagnosis Date Noted  . Retained placenta with hemorrhage 11/03/2018  . Encounter for elective induction of labor 10/05/2018  . Hemorrhoids 06/02/2018  . Encounter for supervision of normal pregnancy, antepartum 03/08/2018  . Anxiety disorder 11/29/2017    Past Surgical History:  Procedure Laterality Date  . APPENDECTOMY    . WISDOM TOOTH EXTRACTION      Prior to Admission medications   Medication Sig Start Date End Date Taking? Authorizing Provider  docusate sodium (COLACE) 100 MG capsule Take 1 capsule (100 mg total) by mouth daily as needed. Patient not taking: Reported on 11/03/2018 10/08/18 10/08/19  Homero Fellers, MD  ibuprofen (ADVIL) 600 MG tablet Take 1 tablet (600 mg total) by mouth every 6 (six) hours. Patient not taking: Reported on 11/03/2018 10/08/18   Homero Fellers, MD  Magnesium 100 MG CAPS Take by mouth.    [provider]  Prenat w/o A Vit-FeFum-FePo-FA (CONCEPT OB) 130-92.4-1 MG CAPS Take 1 capsule by mouth daily. Patient not taking: Reported on 11/03/2018 02/10/18   [name], MD  valACYclovir (VALTREX) 1000 MG tablet Take 1 tablet (1,000 mg total) by mouth daily. Patient not taking: Reported on 11/03/2018 09/15/18   [name], MD    Allergies Patient has no known allergies.  Family History  Problem Relation Age of Onset  . Endometriosis Sister     Social History Social History   Tobacco Use  . Smoking status: Never Smoker  . Smokeless tobacco: Never Used  Substance Use Topics  . Alcohol use: Never    Frequency: Never  . Drug use: Never    Review of Systems Constitutional: Negative for fever. Respiratory: Negative for shortness of breath or cough. Gastrointestinal:  for abdominal pain; negative for nausea , negative for vomiting. Genitourinary: Negative for dysuria , negative for vaginal discharge. Positive for vaginal bleeding. Musculoskeletal: Negative for back pain. Skin: Negative for acute skin changes/rash/lesion. ____________________________________________   PHYSICAL EXAM:  VITAL SIGNS: ED Triage Vitals [11/03/18 1710]  Enc Vitals Group     BP 126/72     Pulse Rate (!) 102     Resp 14  Temp 98.5 F (36.9 C)     Temp Source Oral     SpO2 97 %     Weight 125 lb (56.7 kg)     Height 5\' 6"  (1.676 m)     Head Circumference      Peak Flow      Pain Score 0     Pain Loc      Pain Edu?      Excl. in [place]?     Constitutional: Alert and oriented. Well appearing and in no acute distress. Eyes: Conjunctivae are normal. Head: Atraumatic. Nose: No congestion/rhinnorhea. Mouth/Throat: Mucous membranes are  moist. Respiratory: Normal respiratory effort.  No retractions. Gastrointestinal: Bowel sounds active x 4; Abdomen is soft without rebound or guarding. Genitourinary: Pelvic exam: not indicated. Musculoskeletal: No extremity tenderness nor edema.  Neurologic:  Normal speech and language. No gross focal neurologic deficits are appreciated. Speech is normal. No gait instability. Skin:  Skin is warm, dry and intact. No rash noted on exposed skin. Psychiatric: Mood and affect are normal. Speech and behavior are normal.  ____________________________________________   LABS (all labs ordered are listed, but only abnormal results are displayed)  Labs Reviewed  CBC - Abnormal; Notable for the following components:      Result Value   RBC 3.80 (*)    HCT 35.5 (*)    All other components within normal limits  SARS CORONAVIRUS 2 (HOSPITAL ORDER, [facility])  BASIC METABOLIC PANEL  TYPE AND SCREEN  PREPARE RBC (CROSSMATCH)  SURGICAL PATHOLOGY   ____________________________________________  RADIOLOGY  Transvaginal ultrasound shows a masslike area of abnormal heterogenous echogenicity/enlargement within the mid inferior endometrial canal which measures 4.9 x 2.4 x 2.9 cm and contains internal blood flow on color Doppler imaging which is consistent with retained products of conception. ____________________________________________  Procedures  ____________________________________________  28 year old female presenting to the emergency department for vaginal bleeding that has progressively increased over the past week.  Dr. Georgianne Fick recommended that she come to the emergency department for ultrasound due to concerns of retained products of conception.  Patient is very tearful and states that she has separation anxiety from her 51-week-old son.  Due to COVID-19 pandemic, the patient's husband and child are restricted from coming in with  her.   ----------------------------------------- 7:44 PM on 11/03/2018 -----------------------------------------  Patient passed 2 large dark clots. She continues to deny abdominal pain or cramping. Awaiting ultrasound.   ----------------------------------------- 8:55 PM on 11/03/2018 -----------------------------------------  Ultrasound does show concern for retained products of conception.  Dr. Georgianne Fick has been paged.  ----------------------------------------- 9:06 PM on 11/03/2018 -----------------------------------------  Ultrasound results discussed with the patient.  Dr. Georgianne Fick now at bedside.   INITIAL IMPRESSION / ASSESSMENT AND PLAN / ED COURSE  Pertinent labs & imaging results that were available during my care of the patient were reviewed by me and considered in my medical decision making (see chart for details).  ____________________________________________   FINAL CLINICAL IMPRESSION(S) / ED DIAGNOSES  Final diagnoses:  Vaginal bleeding  Hemorrhage due to retained products of conception    Note:  This document was prepared using Dragon voice recognition software and may include unintentional dictation errors.   Victorino Dike, FNP 11/04/18 0032    Carrie Mew, MD 11/13/18 [phone]

## 2018-11-03 NOTE — ED Notes (Signed)
Report given to Mother-baby unit, spoke with Maddie.

## 2018-11-03 NOTE — ED Notes (Signed)
Dr Star Age with pt and talking to family member att

## 2018-11-03 NOTE — ED Notes (Signed)
PACU, Vicky, RN, called for pt coming, Vladimir Crofts, NP at bedside, pt reporting more heavy bleeding

## 2018-11-03 NOTE — Anesthesia Preprocedure Evaluation (Signed)
Anesthesia Evaluation  Patient identified by MRN, date of birth, ID band Patient awake    Reviewed: Allergy & Precautions, NPO status , Patient's Chart, lab work & pertinent test results, reviewed documented beta blocker date and time   Airway Mallampati: II  TM Distance: >3 FB     Dental  (+) Chipped   Pulmonary           Cardiovascular      Neuro/Psych PSYCHIATRIC DISORDERS Anxiety Depression    GI/Hepatic   Endo/Other    Renal/GU      Musculoskeletal   Abdominal   Peds  Hematology   Anesthesia Other Findings LMA appropriate.  Reproductive/Obstetrics                             Anesthesia Physical Anesthesia Plan  ASA: II  Anesthesia Plan: General   Post-op Pain Management:    Induction: Intravenous  PONV Risk Score and Plan:   Airway Management Planned: LMA  Additional Equipment:   Intra-op Plan:   Post-operative Plan:   Informed Consent: I have reviewed the patients History and Physical, chart, labs and discussed the procedure including the risks, benefits and alternatives for the proposed anesthesia with the patient or authorized representative who has indicated his/her understanding and acceptance.       Plan Discussed with: CRNA  Anesthesia Plan Comments:         Anesthesia Quick Evaluation

## 2018-11-03 NOTE — ED Notes (Signed)
Report given to [place] in the OR.

## 2018-11-03 NOTE — H&P (Signed)
Obstetrics & Gynecology Surgery H&P    Chief Complaint: Scheduled Surgery   History of Present Illness: Patient is a 28 y.o. G1P1001 presenting to ER for heavy bleeding postpartum.  The patient had an uncomplicated vaginal delivery on 10/06/2018, bleeding until this time had been light with patient changing her pad 3 times daily.  This week bleeding increased with passage of clots, feeling lightheaded and dizzy.  Episodes were intermittent at first before further increasing in duration today.  She presented to the clinic were bleeding was evaluated by Dr. Gilman Schmidt and deemed heavier than what would be considered normal at this point. Evaluation in the ED showed a stable H&H from postdelivery values, no elevation in WBC.  She has not had any fevers or chills.    Review of Systems:10 point review of systems  Past Medical History:  Past Medical History:  Diagnosis Date  . Anxiety   . Depression     Past Surgical History:  Past Surgical History:  Procedure Laterality Date  . APPENDECTOMY    . WISDOM TOOTH EXTRACTION      Family History:  Family History  Problem Relation Age of Onset  . Endometriosis Sister     Social History:  Social History   Socioeconomic History  . Marital status: Married    Spouse name: RJ  . Number of children: Not on file  . Years of education: Not on file  . Highest education level: Some college, no degree  Occupational History  . Not on file  Social Needs  . Financial resource strain: Not hard at all  . Food insecurity    Worry: Never true    Inability: Never true  . Transportation needs    Medical: No    Non-medical: No  Tobacco Use  . Smoking status: Never Smoker  . Smokeless tobacco: Never Used  Substance and Sexual Activity  . Alcohol use: Never    Frequency: Never  . Drug use: Never  . Sexual activity: Not Currently    Comment: undecided  Lifestyle  . Physical activity    Days per week: 0 days    Minutes per session: 0 min  .  Stress: To some extent  Relationships  . Social connections    Talks on phone: More than three times a week    Gets together: Once a week    Attends religious service: More than 4 times per year    Active member of club or organization: No    Attends meetings of clubs or organizations: Never    Relationship status: Married  . Intimate partner violence    Fear of current or ex partner: No    Emotionally abused: No    Physically abused: No    Forced sexual activity: No  Other Topics Concern  . Not on file  Social History Narrative  . Not on file    Allergies:  No Known Allergies  Medications: Prior to Admission medications   Medication Sig Start Date End Date Taking? Authorizing Provider  docusate sodium (COLACE) 100 MG capsule Take 1 capsule (100 mg total) by mouth daily as needed. Patient not taking: Reported on 11/03/2018 10/08/18 10/08/19  Homero Fellers, MD  ibuprofen (ADVIL) 600 MG tablet Take 1 tablet (600 mg total) by mouth every 6 (six) hours. Patient not taking: Reported on 11/03/2018 10/08/18   Homero Fellers, MD  Magnesium 100 MG CAPS Take by mouth.    [provider]  Prenat w/o A Vit-FeFum-FePo-FA (  CONCEPT OB) 130-92.4-1 MG CAPS Take 1 capsule by mouth daily. Patient not taking: Reported on 11/03/2018 02/10/18   [name], MD  valACYclovir (VALTREX) 1000 MG tablet Take 1 tablet (1,000 mg total) by mouth daily. Patient not taking: Reported on 11/03/2018 09/15/18   [name], MD    Physical Exam Vitals: Blood pressure 126/72, pulse (!) 102, temperature 98.5 F (36.9 C), temperature source Oral, resp. rate 14, height 5\' 6"  (1.676 m), weight 56.7 kg, SpO2 97 %, currently breastfeeding. General: NAD, appears stated age., NAD HEENT: normocephalic, anicteric Pulmonary: No increased work of breathing, CTAB Cardiovascular: RRR, distal pulses 2+ Abdomen: soft, non-tender Genitourinary: deferred Extremities: no edema, erythema, or  tenderness Neurologic: Grossly intact Psychiatric: mood appropriate, nervous, affect full  Imaging US Pelvic Complete With Transvaginal  Result Date: 11/03/2018 CLINICAL DATA:  Heavy vaginal bleeding for weeks postpartum question retained products of conception EXAM: TRANSABDOMINAL AND TRANSVAGINAL ULTRASOUND OF PELVIS TECHNIQUE: Both transabdominal and transvaginal ultrasound examinations of the pelvis were performed. Transabdominal technique was performed for global imaging of the pelvis including uterus, ovaries, adnexal regions, and pelvic cul-de-sac. It was necessary to proceed with endovaginal exam following the transabdominal exam to visualize the ovaries. COMPARISON:  OB ultrasound 02/03/2018 FINDINGS: Uterus Measurements: 10.1 x 4.9 x 6.7 cm = volume: 173 mL. Normal myometrial echogenicity without focal mass Endometrium Thickness: 4 mm superiorly. Focal abnormal appearance of the endometrial complex at the mid inferior uterus, where a large masslike area of heterogeneous echogenicity is identified measuring 4.9 x 2.4 x 2.9 cm. Mass contains areas of hyperechogenicity, complex effacing fluid and soft tissue echogenicity. This area demonstrates abnormal internal blood flow on color Doppler imaging. Findings are consistent with retained products of conception. Right ovary Measurements: 2.8 x 2.2 x 1.9 cm = volume: 6.2 mL. Suboptimally visualized due to bowel. No gross abnormalities. Left ovary Measurements: 2.8 x 2.1 x 1.6 cm = volume: 4.6 mL. Normal morphology without mass Other findings No free pelvic fluid.  No adnexal masses. IMPRESSION: Masslike area of abnormal heterogeneous echogenicity/enlargement within the mid inferior endometrial canal measuring 4.9 x 2.4 x 2.9 cm and containing internal blood flow on color Doppler imaging consistent with retained products of conception. Electronically Signed   By: Lavonia Dana M.D.   On: 11/03/2018 20:36    Assessment: 28 y.o. G1P1001 presenting for  scheduled D&C  Plan: 1) I have discussed with the patient the indications for the procedure. Included in the discussion were the options of therapy, as wall as their individual risks, benefits, and complications. Her ultrasound did show an area concerning for retained placental tissue.  Time frame of hemorrhage unusual.  We discussed these findings as well as various etiologies.  Focal placenta acreta is in the differential as well.  We discussed risk of hemorrhage, infections, and perforation with risk for damage to surrounding structures.  Ample time was given to answer all questions.  After consideration of her history and findings, mutual decision has been made to proceed with D+C. While the incidence is low, the risks from this surgery include, but are not limited to, the risks of anesthesia, hemorrhage, infection, perforation, and injury to adjacent structures including bowel, bladder and blood vessels.  - SCD's - 2 units cross matched - Gentamycin/clindamycin IV - Methergine on standby should heavier than expected bleeding be encountered - anticipate overnight stay to verify bleeding remains appropriate and improved post procedure  2) Routine postoperative instructions were reviewed with the patient and her family in detail today  including the expected length of recovery and likely postoperative course.  The patient concurred with the proposed plan, giving informed written consent for the surgery today.  Patient instructed on the importance of being NPO after midnight prior to her procedure.  If warranted preoperative prophylactic antibiotics and SCDs ordered on call to the OR to meet SCIP guidelines and adhere to recommendation laid forth in [place] Number 104 May 2009  "Antibiotic Prophylaxis for Gynecologic Procedures".     [name], MD, [facility], [facility] 11/03/2018, 9:28 PM

## 2018-11-03 NOTE — ED Notes (Signed)
Patient taken to ultrasound.

## 2018-11-03 NOTE — ED Triage Notes (Signed)
Pt to ED from home c/o vaginal bleeding for the past week on and off, today had heavy bleeding.  Pt is 4 weeks postpartum.  Dr. Georgianne Fick worried about retained products.  Pt presents A&Ox4, chest rise even and unlabored, skin warm and dry, tearful but NAD at this time.

## 2018-11-03 NOTE — Progress Notes (Signed)
Patient ID: Vickie Manning, female   DOB: 1991-04-30, 28 y.o.   MRN: [id number]  Reason for Consult: Postpartum Care (Heavy bleeding, no pain or cramping, is light headed )   Referred by Delsa Grana, PA-C  Subjective:     HPI:  Vickie Manning is a 28 y.o. female She reports that she has been having heavy bleeding for about 6 days.  She is feeling dizzy and light headed. Today when she was bathing her son she stood up and had blood running down her leg. She spent an hour on the toilet passing blood clots.   Past Medical History:  Diagnosis Date  . Anxiety   . Depression    Family History  Problem Relation Age of Onset  . Endometriosis Sister    Past Surgical History:  Procedure Laterality Date  . APPENDECTOMY    . WISDOM TOOTH EXTRACTION      Short Social History:  Social History   Tobacco Use  . Smoking status: Never Smoker  . Smokeless tobacco: Never Used  Substance Use Topics  . Alcohol use: Never    Frequency: Never    No Known Allergies  Current Outpatient Medications  Medication Sig Dispense Refill  . docusate sodium (COLACE) 100 MG capsule Take 1 capsule (100 mg total) by mouth daily as needed. 30 capsule 2  . ibuprofen (ADVIL) 600 MG tablet Take 1 tablet (600 mg total) by mouth every 6 (six) hours. 30 tablet 2  . Prenat w/o A Vit-FeFum-FePo-FA (CONCEPT OB) 130-92.4-1 MG CAPS Take 1 capsule by mouth daily. 30 capsule 11  . Magnesium 100 MG CAPS Take by mouth.    . valACYclovir (VALTREX) 1000 MG tablet Take 1 tablet (1,000 mg total) by mouth daily. (Patient not taking: Reported on 11/03/2018) 30 tablet 1   No current facility-administered medications for this visit.     Review of Systems  Constitutional: Negative for chills, fatigue, fever and unexpected weight change.  HENT: Negative for trouble swallowing.  Eyes: Negative for loss of vision.  Respiratory: Negative for cough, shortness of breath and wheezing.  Cardiovascular: Negative for chest pain, leg  swelling, palpitations and syncope.  GI: Negative for abdominal pain, blood in stool, diarrhea, nausea and vomiting.  GU: Negative for difficulty urinating, dysuria, frequency and hematuria.  Musculoskeletal: Negative for back pain, leg pain and joint pain.  Skin: Negative for rash.  Neurological: Positive for dizziness. Negative for headaches, light-headedness, numbness and seizures.  Psychiatric: Negative for behavioral problem, confusion, depressed mood and sleep disturbance.        Objective:  Objective   Vitals:   11/03/18 1558  BP: (!) 90/50  Pulse: (!) 105  Weight: 125 lb (56.7 kg)  Height: 5\' 6"  (1.676 m)   Body mass index is 20.18 kg/m.  Physical Exam Vitals signs and nursing note reviewed.  Constitutional:      Appearance: She is well-developed.  HENT:     Head: Normocephalic and atraumatic.  Eyes:     Pupils: Pupils are equal, round, and reactive to light.  Cardiovascular:     Rate and Rhythm: Normal rate and regular rhythm.  Pulmonary:     Effort: Pulmonary effort is normal. No respiratory distress.  Genitourinary:    Comments: Enlarged 25 cm uterus, no CMT. No adnexal masses. Normal vulva and perineum. Skin:    General: Skin is warm and dry.  Neurological:     Mental Status: She is alert and oriented to person, place, and time.  Psychiatric:  Behavior: Behavior normal.        Thought Content: Thought content normal.        Judgment: Judgment normal.    Speculum examination: normal appearing cervix, slow vaignal bleeding. Large 10cc clot I the vaginal vault.  Enlarged 15cm uterus on bimanual examination.       Assessment/Plan:     28 yo with heavy menstrual bleeding, 4 weeks postpartum. Will send to the ER for ultrasound and further evaluation. Discussed with ER charge nurse and Dr. Georgianne Fick the [place] on call.   More than 15 minutes were spent face to face with the patient in the room with more than 50% of the time spent providing  counseling and discussing the plan of management.    Adrian Prows MD [facility], [facility] 11/03/2018 4:09 PM

## 2018-11-03 NOTE — ED Notes (Signed)
Patient came out to the hallway asking for assistance. Patient had blood running freely down both legs. Patient had a large clot covering pad. C. Triplett aware.

## 2018-11-04 LAB — CBC
HCT: 29.6 % — ABNORMAL LOW (ref 36.0–46.0)
HCT: 25.5 % — ABNORMAL LOW (ref 36.0–46.0)
Hemoglobin: 9.5 g/dL — ABNORMAL LOW (ref 12.0–15.0)
Hemoglobin: 8.6 g/dL — ABNORMAL LOW (ref 12.0–15.0)
MCH: 31.1 pg (ref 26.0–34.0)
MCH: 32 pg (ref 26.0–34.0)
MCHC: 32.1 g/dL (ref 30.0–36.0)
MCHC: 33.7 g/dL (ref 30.0–36.0)
MCV: 97 fL (ref 80.0–100.0)
MCV: 94.8 fL (ref 80.0–100.0)
Platelets: 270 10*3/uL (ref 150–400)
Platelets: 238 10*3/uL (ref 150–400)
RBC: 3.05 MIL/uL — ABNORMAL LOW (ref 3.87–5.11)
RBC: 2.69 MIL/uL — ABNORMAL LOW (ref 3.87–5.11)
RDW: 12.4 % (ref 11.5–15.5)
RDW: 12.3 % (ref 11.5–15.5)
WBC: 9 10*3/uL (ref 4.0–10.5)
WBC: 7.9 10*3/uL (ref 4.0–10.5)
nRBC: 0 % (ref 0.0–0.2)
nRBC: 0 % (ref 0.0–0.2)

## 2018-11-04 LAB — GLUCOSE, CAPILLARY: Glucose-Capillary: 151 mg/dL — ABNORMAL HIGH (ref 70–99)

## 2018-11-04 MED ORDER — ONDANSETRON HCL 4 MG/2ML IJ SOLN: 4.0000 mg | Freq: Once | INTRAMUSCULAR | Status: DC | PRN

## 2018-11-04 MED ORDER — METHYLERGONOVINE MALEATE 0.2 MG/ML IJ SOLN
0.2000 mg | Freq: Three times a day (TID) | INTRAMUSCULAR | Status: DC
  Administered 2018-11-04: 0.2 mg via INTRAMUSCULAR
  Filled 2018-11-04: qty 1

## 2018-11-04 MED ORDER — FENTANYL CITRATE (PF) 100 MCG/2ML IJ SOLN
25.0000 ug | INTRAMUSCULAR | Status: DC | PRN
  Administered 2018-11-04 (×4): 25 ug via INTRAVENOUS

## 2018-11-04 MED ORDER — SODIUM CHLORIDE 0.9% IV SOLUTION
Freq: Once | INTRAVENOUS | Status: AC
  Administered 2018-11-04: 11:00:00 via INTRAVENOUS

## 2018-11-04 MED ORDER — DEXTROSE-NACL 5-0.45 % IV SOLN
INTRAVENOUS | Status: DC
  Administered 2018-11-04: 03:00:00 via INTRAVENOUS

## 2018-11-04 MED ORDER — SODIUM CHLORIDE 0.9 % IV BOLUS
300.0000 mL | Freq: Once | INTRAVENOUS | Status: AC
  Administered 2018-11-04: 09:00:00 300 mL via INTRAVENOUS

## 2018-11-04 MED ORDER — METHYLERGONOVINE MALEATE 0.2 MG PO TABS: 0.2000 mg | ORAL_TABLET | Freq: Three times a day (TID) | ORAL | 0 refills | Status: AC

## 2018-11-04 MED ORDER — MORPHINE SULFATE (PF) 2 MG/ML IV SOLN
1.0000 mg | INTRAVENOUS | Status: DC | PRN
  Administered 2018-11-04: 04:00:00 1 mg via INTRAVENOUS
  Administered 2018-11-04 (×2): 2 mg via INTRAVENOUS
  Filled 2018-11-04 (×3): qty 1

## 2018-11-04 MED ORDER — ACETAMINOPHEN 325 MG PO TABS
650.0000 mg | ORAL_TABLET | ORAL | Status: DC | PRN
  Administered 2018-11-04: 03:00:00 650 mg via ORAL
  Filled 2018-11-04: qty 2

## 2018-11-04 MED ORDER — BREAST MILK/FORMULA (FOR LABEL PRINTING ONLY): ORAL | Status: DC

## 2018-11-04 MED ORDER — PHENYLEPHRINE HCL (PRESSORS) 10 MG/ML IV SOLN
INTRAVENOUS | Status: DC | PRN
  Administered 2018-11-04 (×3): 100 ug via INTRAVENOUS

## 2018-11-04 MED ORDER — HYDROCODONE-ACETAMINOPHEN 5-325 MG PO TABS: 1.0000 | ORAL_TABLET | Freq: Four times a day (QID) | ORAL | 0 refills | Status: DC | PRN

## 2018-11-04 MED ORDER — FENTANYL CITRATE (PF) 100 MCG/2ML IJ SOLN
INTRAMUSCULAR | Status: AC
  Filled 2018-11-04: qty 2

## 2018-11-04 MED ORDER — SODIUM CHLORIDE 0.9 % IV SOLN
INTRAVENOUS | Status: DC
  Administered 2018-11-04: 01:00:00 via INTRAVENOUS

## 2018-11-04 MED ORDER — FERROUS SULFATE 325 (65 FE) MG PO TABS: 325.0000 mg | ORAL_TABLET | Freq: Every day | ORAL | 3 refills | Status: DC

## 2018-11-04 MED ORDER — BREAST MILK/FORMULA (FOR LABEL PRINTING ONLY)
ORAL | Status: DC
  Filled 2018-11-04: qty 1

## 2018-11-04 MED ORDER — ONDANSETRON HCL 4 MG/2ML IJ SOLN: 4.0000 mg | Freq: Four times a day (QID) | INTRAMUSCULAR | Status: DC | PRN

## 2018-11-04 MED ORDER — ONDANSETRON HCL 4 MG PO TABS: 4.0000 mg | ORAL_TABLET | Freq: Four times a day (QID) | ORAL | Status: DC | PRN

## 2018-11-04 NOTE — Progress Notes (Signed)
Subjective: Patient reports no issues with bleeding overnight, some cramping from catheter.  Pain is well-controlled on current  analgesic regimen..    Objective: Vital signs in last 24 hours: Temp:  [96.8 F (36 C)-99.5 F (37.5 C)] 98.3 F (36.8 C) (06/13 0706) Pulse Rate:  [75-107] 75 (06/13 0706) Resp:  [8-18] 16 (06/13 0706) BP: (73-126)/(50-76) 91/65 (06/13 0706) SpO2:  [92 %-100 %] 98 % (06/13 0706) Weight:  [56.7 kg] 56.7 kg (06/12 1710)    Intake/Output from previous day: 06/12 0701 - 06/13 0700 In: 1230 [P.O.:30; I.V.:1200] Out: 400 [Blood:400]  Physical Examination: General: NAD Pulmonary: no increased work of breathing Abdomen: soft, non-tender, non-distended, incision(s) D/C/I GU: normal external female genitalia no blood on pad, minimal amount of blood in foley tubing with no blood noted in foley bag. Extremities: no edema Neurologic: normal gait   Labs: Results for orders placed or performed during the hospital encounter of 11/03/18 (from the past 72 hour(s))  Type and screen [place]     Status: None (Preliminary result)   Collection Time: 11/03/18  5:19 PM  Result Value Ref Range   ABO/RH(D) O POS    Antibody Screen NEG    Sample Expiration      11/06/2018,2359 Performed at [facility], [address] [place], [place] [address]    Unit Number [id number]    Blood Component Type RED CELLS,LR    Unit division 00    Status of Unit ALLOCATED    Transfusion Status OK TO TRANSFUSE    Crossmatch Result Compatible    Unit Number [id number]    Blood Component Type RBC LR PHER1    Unit division 00    Status of Unit ALLOCATED    Transfusion Status OK TO TRANSFUSE    Crossmatch Result Compatible   CBC     Status: Abnormal   Collection Time: 11/03/18  5:19 PM  Result Value Ref Range   WBC 9.9 4.0 - 10.5 K/uL   RBC 3.80 (L) 3.87 - 5.11 MIL/uL   Hemoglobin 12.0 12.0 - 15.0 g/dL   HCT 35.5 (L) 36.0 - 46.0 %   MCV 93.4 80.0 - 100.0 fL   MCH 31.6 26.0 - 34.0 pg   MCHC 33.8 30.0 - 36.0 g/dL   RDW 12.3 11.5 - 15.5 %   Platelets 319 150 - 400 K/uL   nRBC 0.0 0.0 - 0.2 %    Comment: Performed at [facility], [address] [place], [place] [address]  Basic metabolic panel     Status: None   Collection Time: 11/03/18  5:19 PM  Result Value Ref Range   Sodium 140 135 - 145 mmol/L   Potassium 3.8 3.5 - 5.1 mmol/L   Chloride 106 98 - 111 mmol/L   CO2 25 22 - 32 mmol/L   Glucose, Bld 95 70 - 99 mg/dL   BUN 16 6 - 20 mg/dL   Creatinine, Ser 0.62 0.44 - 1.00 mg/dL   Calcium 9.2 8.9 - 10.3 mg/dL   GFR calc non Af Amer >60 >60 mL/min   GFR calc Af Amer >60 >60 mL/min   Anion gap 9 5 - 15    Comment: Performed at [facility], [address] [place], [place] [address]  Prepare RBC     Status: None   Collection Time: 11/03/18  5:19 PM  Result Value Ref Range   Order Confirmation      ORDER PROCESSED BY BLOOD  BANK Performed at [facility], [address], [place], [place] [address]   SARS Coronavirus 2 (CEPHEID - Performed in [facility] hospital lab), Hosp Order     Status: None   Collection Time: 11/03/18  9:07 PM   Specimen: Nasopharyngeal Swab  Result Value Ref Range   SARS Coronavirus 2 NEGATIVE NEGATIVE    Comment: (NOTE) If result is NEGATIVE SARS-CoV-2 target nucleic acids are NOT DETECTED. The SARS-CoV-2 RNA is generally detectable in upper and lower  respiratory specimens during the acute phase of infection. The lowest  concentration of SARS-CoV-2 viral copies this assay can detect is 250  copies / mL. A negative result does not preclude SARS-CoV-2 infection  and should not be used as the sole basis for treatment or other  patient management decisions.  A negative result may occur with  improper specimen collection / handling, submission of specimen other  than nasopharyngeal swab, presence of viral mutation(s) within the  areas targeted by this  assay, and inadequate number of viral copies  (<250 copies / mL). A negative result must be combined with clinical  observations, patient history, and epidemiological information. If result is POSITIVE SARS-CoV-2 target nucleic acids are DETECTED. The SARS-CoV-2 RNA is generally detectable in upper and lower  respiratory specimens dur ing the acute phase of infection.  Positive  results are indicative of active infection with SARS-CoV-2.  Clinical  correlation with patient history and other diagnostic information is  necessary to determine patient infection status.  Positive results do  not rule out bacterial infection or co-infection with other viruses. If result is PRESUMPTIVE POSTIVE SARS-CoV-2 nucleic acids MAY BE PRESENT.   A presumptive positive result was obtained on the submitted specimen  and confirmed on repeat testing.  While 2019 novel coronavirus  (SARS-CoV-2) nucleic acids may be present in the submitted sample  additional confirmatory testing may be necessary for epidemiological  and / or clinical management purposes  to differentiate between  SARS-CoV-2 and other Sarbecovirus currently known to infect humans.  If clinically indicated additional testing with an alternate test  methodology [phone]) is advised. The SARS-CoV-2 RNA is generally  detectable in upper and lower respiratory sp ecimens during the acute  phase of infection. The expected result is Negative. Fact Sheet for Patients:  StrictlyIdeas.no Fact Sheet for Healthcare Providers: BankingDealers.co.za This test is not yet approved or cleared by the [place] FDA and has been authorized for detection and/or diagnosis of SARS-CoV-2 by FDA under an Emergency Use Authorization (EUA).  This EUA will remain in effect (meaning this test can be used) for the duration of the COVID-19 declaration under Section 564(b)(1) of the Act, 21 U.S.C. section 360bbb-3(b)(1),  unless the authorization is terminated or revoked sooner. Performed at [facility], [address] [place], [place] [address]   CBC     Status: Abnormal   Collection Time: 11/04/18  1:04 AM  Result Value Ref Range   WBC 9.0 4.0 - 10.5 K/uL   RBC 3.05 (L) 3.87 - 5.11 MIL/uL   Hemoglobin 9.5 (L) 12.0 - 15.0 g/dL   HCT 29.6 (L) 36.0 - 46.0 %   MCV 97.0 80.0 - 100.0 fL   MCH 31.1 26.0 - 34.0 pg   MCHC 32.1 30.0 - 36.0 g/dL   RDW 12.4 11.5 - 15.5 %   Platelets 270 150 - 400 K/uL   nRBC 0.0 0.0 - 0.2 %    Comment: Performed at [facility], [address] [place], [place] [address]  CBC     Status: Abnormal   Collection Time: 11/04/18  6:40 AM  Result Value Ref Range   WBC 7.9 4.0 - 10.5 K/uL   RBC 2.69 (L) 3.87 - 5.11 MIL/uL   Hemoglobin 8.6 (L) 12.0 - 15.0 g/dL   HCT 25.5 (L) 36.0 - 46.0 %   MCV 94.8 80.0 - 100.0 fL   MCH 32.0 26.0 - 34.0 pg   MCHC 33.7 30.0 - 36.0 g/dL   RDW 12.3 11.5 - 15.5 %   Platelets 238 150 - 400 K/uL   nRBC 0.0 0.0 - 0.2 %    Comment: Performed at [facility], [address], [place], [place] [address]     Assessment:  28 y.o. s/p 1 Day Post-Op Procedure(s) (LRB): DILATATION AND CURETTAGE (N/A) : stable  Plan: 1) Pain: morphine IV prn  2) Heme:Anemia: acute blood loss, asymptomatic with stable vitals - continue methergine  3) FEN: NPO  4) Prophylaxis: intermittent pneumatic compression boots.  5) Disposition: Pending re-evaluation after removal of intrauterine foley catheter.  If bleeding remains stable anticipate discharge later this afternoon.  If bleeding increases only other options would be uterine artery embolization or emergency hysterectomy  [name], MD, [place], [facility] 11/04/2018, 8:23 AM

## 2018-11-04 NOTE — Discharge Summary (Signed)
Physician Discharge Summary  Patient ID: Vickie Manning MRN: [id number] DOB/AGE: [birth year] 28 y.o.  Admit date: 11/03/2018 Discharge date: 11/04/2018  Admission Diagnoses: Retained placenta with hemorrhage  Discharge Diagnoses:  Active Problems:   Retained placenta with hemorrhage   Discharged Condition: stable  Hospital Course: 28 y.o. [id number] s/p uncomplicated vaginal delivery 5/15 who presented after a week of increase in vaginal bleeding.  Ultrasound evaluation revealed stable H&H but ultrasound concerning for retained POC.  The patient was taken to the OR for emergent D&C.  While bleeding subsided she continued to have bleeding so a foley catheter was placed into the endometrial cavity filled with 76mL of saline to hold pressure.  The patient was given 487mcg of cytotec rectally and continued on methergine postoperatively.  Foley catheter was removed 8-hrs after procedure and bleeding remained scant over the next 6-hrs.  The patient did have a near syncopal episode the first time she ambulated to the bathroom with assistance so she did received a unit of pRBC prior to discharge.  She was given strict bleeding precautions.  Any further bleeding would require attempted uterine artery embolization or hysterectomy for management.    Consults: None  Significant Diagnostic Studies:  Results for orders placed or performed during the hospital encounter of 11/03/18 (from the past 24 hour(s))  Type and screen [place]     Status: None (Preliminary result)   Collection Time: 11/03/18  5:19 PM  Result Value Ref Range   ABO/RH(D) O POS    Antibody Screen NEG    Sample Expiration 11/06/2018,2359    Unit Number [id number]    Blood Component Type RED CELLS,LR    Unit division 00    Status of Unit ISSUED    Transfusion Status OK TO TRANSFUSE    Crossmatch Result      Compatible Performed at [facility], [address], [place], [place] [address]    Unit  Number [id number]    Blood Component Type RBC LR PHER1    Unit division 00    Status of Unit ALLOCATED    Transfusion Status OK TO TRANSFUSE    Crossmatch Result Compatible   CBC     Status: Abnormal   Collection Time: 11/03/18  5:19 PM  Result Value Ref Range   WBC 9.9 4.0 - 10.5 K/uL   RBC 3.80 (L) 3.87 - 5.11 MIL/uL   Hemoglobin 12.0 12.0 - 15.0 g/dL   HCT 35.5 (L) 36.0 - 46.0 %   MCV 93.4 80.0 - 100.0 fL   MCH 31.6 26.0 - 34.0 pg   MCHC 33.8 30.0 - 36.0 g/dL   RDW 12.3 11.5 - 15.5 %   Platelets 319 150 - 400 K/uL   nRBC 0.0 0.0 - 0.2 %  Basic metabolic panel     Status: None   Collection Time: 11/03/18  5:19 PM  Result Value Ref Range   Sodium 140 135 - 145 mmol/L   Potassium 3.8 3.5 - 5.1 mmol/L   Chloride 106 98 - 111 mmol/L   CO2 25 22 - 32 mmol/L   Glucose, Bld 95 70 - 99 mg/dL   BUN 16 6 - 20 mg/dL   Creatinine, Ser 0.62 0.44 - 1.00 mg/dL   Calcium 9.2 8.9 - 10.3 mg/dL   GFR calc non Af Amer >60 >60 mL/min   GFR calc Af Amer >60 >60 mL/min   Anion gap 9 5 - 15  Prepare RBC     Status: None  Collection Time: 11/03/18  5:19 PM  Result Value Ref Range   Order Confirmation      ORDER PROCESSED BY BLOOD BANK Performed at [facility], [address] [place], [place] [address]   SARS Coronavirus 2 (CEPHEID - Performed in [facility] hospital lab), Hosp Order     Status: None   Collection Time: 11/03/18  9:07 PM   Specimen: Nasopharyngeal Swab  Result Value Ref Range   SARS Coronavirus 2 NEGATIVE NEGATIVE  CBC     Status: Abnormal   Collection Time: 11/04/18  1:04 AM  Result Value Ref Range   WBC 9.0 4.0 - 10.5 K/uL   RBC 3.05 (L) 3.87 - 5.11 MIL/uL   Hemoglobin 9.5 (L) 12.0 - 15.0 g/dL   HCT 29.6 (L) 36.0 - 46.0 %   MCV 97.0 80.0 - 100.0 fL   MCH 31.1 26.0 - 34.0 pg   MCHC 32.1 30.0 - 36.0 g/dL   RDW 12.4 11.5 - 15.5 %   Platelets 270 150 - 400 K/uL   nRBC 0.0 0.0 - 0.2 %  CBC     Status: Abnormal   Collection Time: 11/04/18  6:40 AM   Result Value Ref Range   WBC 7.9 4.0 - 10.5 K/uL   RBC 2.69 (L) 3.87 - 5.11 MIL/uL   Hemoglobin 8.6 (L) 12.0 - 15.0 g/dL   HCT 25.5 (L) 36.0 - 46.0 %   MCV 94.8 80.0 - 100.0 fL   MCH 32.0 26.0 - 34.0 pg   MCHC 33.7 30.0 - 36.0 g/dL   RDW 12.3 11.5 - 15.5 %   Platelets 238 150 - 400 K/uL   nRBC 0.0 0.0 - 0.2 %  Glucose, capillary     Status: Abnormal   Collection Time: 11/04/18  9:01 AM  Result Value Ref Range   Glucose-Capillary 151 (H) 70 - 99 mg/dL   US Pelvic Complete With Transvaginal  Result Date: 11/03/2018 CLINICAL DATA:  Heavy vaginal bleeding for weeks postpartum question retained products of conception EXAM: TRANSABDOMINAL AND TRANSVAGINAL ULTRASOUND OF PELVIS TECHNIQUE: Both transabdominal and transvaginal ultrasound examinations of the pelvis were performed. Transabdominal technique was performed for global imaging of the pelvis including uterus, ovaries, adnexal regions, and pelvic cul-de-sac. It was necessary to proceed with endovaginal exam following the transabdominal exam to visualize the ovaries. COMPARISON:  OB ultrasound 02/03/2018 FINDINGS: Uterus Measurements: 10.1 x 4.9 x 6.7 cm = volume: 173 mL. Normal myometrial echogenicity without focal mass Endometrium Thickness: 4 mm superiorly. Focal abnormal appearance of the endometrial complex at the mid inferior uterus, where a large masslike area of heterogeneous echogenicity is identified measuring 4.9 x 2.4 x 2.9 cm. Mass contains areas of hyperechogenicity, complex effacing fluid and soft tissue echogenicity. This area demonstrates abnormal internal blood flow on color Doppler imaging. Findings are consistent with retained products of conception. Right ovary Measurements: 2.8 x 2.2 x 1.9 cm = volume: 6.2 mL. Suboptimally visualized due to bowel. No gross abnormalities. Left ovary Measurements: 2.8 x 2.1 x 1.6 cm = volume: 4.6 mL. Normal morphology without mass Other findings No free pelvic fluid.  No adnexal masses.  IMPRESSION: Masslike area of abnormal heterogeneous echogenicity/enlargement within the mid inferior endometrial canal measuring 4.9 x 2.4 x 2.9 cm and containing internal blood flow on color Doppler imaging consistent with retained products of conception. Electronically Signed   By: Lavonia Dana M.D.   On: 11/03/2018 20:36    Treatments: surgery: Dilation and Curettage  Discharge Exam: Blood  pressure 94/62, pulse 69, temperature 98 F (36.7 C), temperature source Oral, resp. rate 17, height 5\' 6"  (1.676 m), weight 56.7 kg, SpO2 99 %, currently breastfeeding. General: NAD Pulmonary: no increased work of breathing Abdomen: soft, non-tender, non-distended, incision(s) D/C/I GU: normal external female genitalia scant blood noted on pad, appropriate for post D&C Extremities: no edema Neurologic: normal gait    Disposition: Discharge disposition: 01-Home or Self Care       Discharge Instructions    Activity as tolerated   Complete by: As directed    Call MD for:   Complete by: As directed    For heavy vaginal bleeding greater than 1 pad an hour   Call MD for:  difficulty breathing, headache or visual disturbances   Complete by: As directed    Call MD for:  extreme fatigue   Complete by: As directed    Call MD for:  hives   Complete by: As directed    Call MD for:  persistant dizziness or light-headedness   Complete by: As directed    Call MD for:  persistant nausea and vomiting   Complete by: As directed    Call MD for:  severe uncontrolled pain   Complete by: As directed    Call MD for:  temperature >100.4   Complete by: As directed    Diet general   Complete by: As directed    Sexual acrtivity   Complete by: As directed    No intercourse, tampons, or anything vaginally for 2 weeks     Allergies as of 11/04/2018   No Known Allergies     Medication List    STOP taking these medications   docusate sodium 100 MG capsule Commonly known as: Colace   valACYclovir 1000  MG tablet Commonly known as: VALTREX     TAKE these medications   Concept OB 130-92.4-1 MG Caps Take 1 capsule by mouth daily.   ferrous sulfate 325 (65 FE) MG tablet Commonly known as: FerrouSul Take 1 tablet (325 mg total) by mouth daily.   HYDROcodone-acetaminophen 5-325 MG tablet Commonly known as: NORCO/VICODIN Take 1 tablet by mouth every 6 (six) hours as needed.   ibuprofen 600 MG tablet Commonly known as: ADVIL Take 1 tablet (600 mg total) by mouth every 6 (six) hours.   Magnesium 100 MG Caps Take by mouth.   methylergonovine 0.2 MG tablet Commonly known as: METHERGINE Take 1 tablet (0.2 mg total) by mouth 3 (three) times daily for 3 days.      Follow-up Information    [name], MD Follow up.   Specialty: Obstetrics and Gynecology Why: for postop check Contact information: [address] [place] [address] [phone]           Signed: [name] 11/04/2018, 1:47 PM

## 2018-11-04 NOTE — OR Nursing (Signed)
IV bolus of 300 ml. NS infused.

## 2018-11-04 NOTE — Anesthesia Postprocedure Evaluation (Signed)
Anesthesia Post Note  Patient: Yariana Hoaglund  Procedure(s) Performed: DILATATION AND CURETTAGE (N/A )  Patient location during evaluation: PACU Anesthesia Type: General Level of consciousness: awake and alert Pain management: pain level controlled Vital Signs Assessment: post-procedure vital signs reviewed and stable Respiratory status: spontaneous breathing, nonlabored ventilation, respiratory function stable and patient connected to nasal cannula oxygen Cardiovascular status: blood pressure returned to baseline and stable Postop Assessment: no apparent nausea or vomiting Anesthetic complications: no     Last Vitals:  Vitals:   11/04/18 0303 11/04/18 0706  BP: 103/76 91/65  Pulse: 100 75  Resp: 18 16  Temp: 36.6 C 36.8 C  SpO2: 97% 98%    Last Pain:  Vitals:   11/04/18 0706  TempSrc: Oral  PainSc:                  Khaled Herda S

## 2018-11-04 NOTE — Progress Notes (Signed)
11/04/18 0900 11/04/18 0905 11/04/18 0930  What Happened  Was fall witnessed?  --  Yes  --   Who witnessed fall?  --  RN  --   Patients activity before fall  --  bathroom-assisted  --   Point of contact  --  buttocks  --   Was patient injured?  --  No  --   Follow Up  MD notified  --  Georgianne Fick  --   Time MD notified  --  [phone]  --   Family notified  --  No- patient refusal  --   Additional tests  --  No  --   Progress note created (see row info)  --  Yes  --   Adult Fall Risk Assessment  Risk Factor Category (scoring not indicated)  --   --  Fall has occurred during this admission (document High fall risk)  Age  --   --  0  Fall History: Fall within 6 months prior to admission  --   --  0  Elimination; Bowel and/or Urine Incontinence  --   --  0  Elimination; Bowel and/or Urine Urgency/Frequency  --   --  0  Medications: includes PCA/Opiates, Anti-convulsants, Anti-hypertensives, Diuretics, Hypnotics, Laxatives, Sedatives, and Psychotropics  --   --  7  Patient Care Equipment  --   --  3  Mobility-Assistance  --   --  2  Mobility-Gait  --   --  2  Mobility-Sensory Deficit  --   --  0  Altered awareness of immediate physical environment  --   --  0  Impulsiveness  --   --  0  Lack of understanding of one's physical/cognitive limitations  --   --  0  Total Score  --   --  14  Patient Fall Risk Level  --   --  High fall risk  Adult Fall Risk Interventions  Required Bundle Interventions *See Row Information*  --   --  High fall risk - low, moderate, and high requirements implemented  Screening for Fall Injury Risk (To be completed on HIGH fall risk patients) - Assessing Need for Low Bed  Risk For Fall Injury- Low Bed Criteria  --  None identified - Continue screening  --   Screening for Fall Injury Risk (To be completed on HIGH fall risk patients who do not meet crieteria for Low Bed) - Assessing Need for Floor Mats Only  Risk For Fall Injury- Criteria for Floor Mats  --  None  identified - No additional interventions needed  --   Vitals  Temp 97.8 F (36.6 C)  --   --   Temp Source Oral  --   --   BP (!) 95/59  --   --   BP Location Right Arm  --   --   BP Method Automatic  --   --   Patient Position (if appropriate) Semi-fowlers  --   --   Pulse Rate 65  --   --   Resp 14  --   --   Oxygen Therapy  SpO2 97 %  --   --   O2 Device Room Air  --   --   Pain Assessment  Pain Scale  --  0-10  --   Pain Score  --  3  --   Pain Type  --  Surgical pain  --   Pain Location  --  Vagina  --  Pain Descriptors / Indicators  --  Discomfort  --   Pain Frequency  --  Constant  --   Pain Onset  --  On-going  --   Neurological  Neuro (WDL)  --  WDL  --   Level of Consciousness  --  Alert  --   Orientation Level  --  Oriented X4  --   Neuro Additional Assessments  --  No  --   Musculoskeletal  Musculoskeletal (WDL)  --  WDL  --   Assistive Device  --  None  --   Integumentary  Integumentary (WDL)  --  X  --   Skin Color  --  Pale  --   Skin Condition  --  Dry  --   Skin Integrity  --  Intact  --   Skin Turgor  --  Non-tenting  --   Pain Assessment  Date Pain First Started  --  11/04/18  --   Result of Injury  --  No  --   Pain Screening  Clinical Progression  --  Not changed  --   Pain Assessment  Work-Related Injury  --  No  --

## 2018-11-04 NOTE — Progress Notes (Signed)
Patient had a syncopal episode after getting up to go to bathroom with assistance.  Will administer 1 unit of pRBC for symptomatic anemia.  Bleeding has remained stable since removal of intrauterine foley 1-hr ago

## 2018-11-04 NOTE — Progress Notes (Signed)
Called to evaluate bleeding.  Some bleeding around foley catheter with a little bit of blood in the foley catheter tubing none in the bag.  I spoke with the patient and her husband about operative findings.  I discussed that placenta accreta is in the differential given the presentation.  Will continue methergine overnight and monitor bleeding.  I discussed with patient and husband that should bleeding fail to respond to [facility] and medical management possibility exists of need to proceed with hysterectomy for treatment.  I will have this conversation again with the patient after she is a little more awake.

## 2018-11-04 NOTE — Op Note (Signed)
Preoperative Diagnosis: 1) 28 y.o. with delayed postpartum hemorrhage 2) Ultrasound with concern for retained placenta  Postoperative Diagnosis: 1) 28 y.o. with delayed postpartum hemorrhage 2) Ultrasound with concern for retained placenta  Operation Performed: Dilation and curettage  Indication: Delayed postpartum hemorrhage following uncomplicated vaginal delivery 10/06/2018, ultrasound showing focal area of increased vascularity and thickening in the lower mid portion of the uterus  Anesthesia: MAC  Primary Surgeon: [name], MD  Assistant: none  Preoperative Antibiotics: none  Estimated Blood Loss: 400 mL  IV Fluids: 532m  Urine Output:: ~327mstraiggt cath  Antibiotics: Clindamycin and gentamycin   Drains or Tubes: 16Fr foley 2036mf saline  Implants: none  Specimens Removed: endometrial curettings  Complications: none  Intraoperative Findings: Cervix approximately 1cm dilated, small mobile anteverted uterus sounded to 9cm  Patient Condition: stable  Procedure in Detail:  Patient was taken to the operating room were she was administered general endotracheal anesthesia.  She was positioned in the dorsal lithotomy position utilizing Allen stirups, prepped and draped in the usual sterile fashion.  Uterus was noted to be non-enlarged in size, anteverted.   Prior to proceeding with the case a time out was performed.  Attention was turned to the patient's pelvis.  A red rubber catheter was used to empty the patient's bladder.  An operative speculum was placed to allow visualization of the cervix.  Sharp curettage was performed with a good amount tissue removed. Several passes were taken with a large medium size curette followed by a serrated curette noting good uterine cry throughout. The resulting specimen collected and sent to pathology.    There was still some slight oozing noted from the cervical os.  Anesthesia adminsitered 0.2mg18m methergine IM, followed by  400mc1m cytotec placed rectally.  A foley catheter was placed through the cervix an inflated with 20mL 29maline.  Minimal bleeding was noted through the channel of the foley catheter.  Bedside ultrasound revealed a thing endometrial stripe, the area of thickening was no longer noted above the foley cather with the lower uterine segment containing the foley catheter measuring 12.5mm  T73msingle tooth tenaculum was removed from the cervix.  The tenaculum sites and cervix were noted to be  Hemostatic before removing the operative speculum.  Sponge and instrument counts were corrects times two.  The patient tolerated the procedure well and was taken to the recovery room in stable condition.

## 2018-11-04 NOTE — Progress Notes (Signed)
Unit of PRBCs almost complete. Patient color more pink than this morning. Patient states "I feel so much better," and that her emotions have "come back." Patient up to bathroom with 2 RN assist. Ambulated to bathroom and back to bed with no dizziness/lighteadedness. Voided 771ml. Vaginal bleeding small, but lighter than previously before. No clots noted. Patient requesting to eat. Regular diet order placed by provider. Patient states she is ready for discharge. No post-transfusion CBC to be ordered, per Dr. Georgianne Fick. Patient to be discharged this afternoon.

## 2018-11-04 NOTE — Progress Notes (Signed)
End of PRBCs vitals stable. Patient states she feels much better. Ambulated in room with 1 RN. Tolerated well. No dizziness/lightheadedness. Discharge instructions given. Patient prescriptions sent to pharmacy. Patient verbalizes understanding of discharge teaching. Patient discharged home via wheelchair, pushed by staff.

## 2018-11-04 NOTE — Progress Notes (Signed)
Vitals stable and WDL. Per night shift report, patient very tearful during the night because of possibility of hysterectomy. Patient feeling "ok" this morning. Patient pumping and dumping breastmilk, per Dr. Georgianne Fick. Pain 6/10. Patient taking morphine for pain. Small bleeding on peripad. No blood in catheter bag, small amount of blood in catheter tubing. Patient has no lightheadedness/dizziness but is pale. RN to continue to monitor.

## 2018-11-04 NOTE — Transfer of Care (Signed)
Immediate Anesthesia Transfer of Care Note  Patient: Vickie Manning  Procedure(s) Performed: DILATATION AND CURETTAGE (N/A )  Patient Location: PACU  Anesthesia Type:MAC  Level of Consciousness: awake, alert  and oriented  Airway & Oxygen Therapy: Patient Spontanous Breathing and Patient connected to face mask oxygen  Post-op Assessment: Report given to RN and Post -op Vital signs reviewed and stable  Post vital signs: Reviewed and stable  Last Vitals:  Vitals Value Taken Time  BP    Temp    Pulse    Resp    SpO2      Last Pain:  Vitals:   11/03/18 1827  TempSrc:   PainSc: 4          Complications: No apparent anesthesia complications

## 2018-11-04 NOTE — Progress Notes (Signed)
Patient dangled on the side of the bed before getting up to void for the first time at 0845. Patient states no dizziness or lightheadedness. Patient up to the bathroom with 2 RN assist. Urine 331ml. Bleeding small, no clots at this time. When returning back to the bed, patient states "now I feel lightheaded." Syncopal episode at 0850. Patient leaned on one RN at the start of syncopal episode and was lowered to the bathroom floor by 2 RNs. No injuries were sustained. Staff assist button pressed. 2 additional RNs and an OB to bedside. After a few minutes, patient alert and oriented X3. Patient states "did I die?" Situation explained to patient and 2 RNs helped patient up to wheelchair. After patient helped back to bed, vitals were taken and stable. Patient oriented x4. Patient states she is ok, just feels like she doesn't have any emotions. RN stayed with patient for 10 minutes after episode.  Provider spoke with patient about receiving blood transfusion. Patient agrees. Consent signed and placed in chart. Patient to receive 1 unit PRBCs.  RN to continue to monitor.

## 2018-11-04 NOTE — Progress Notes (Signed)
Unit of PRBCs hung at 1117. Patient up to bedside commode shortly after with 2 RN assist. Voided 187ml, small bleeding. Patient states no dizziness or lightheadedness while standing up or on commode. Patient still pale. Patient assisted back to bed. RN to continue to monitor

## 2018-11-06 ENCOUNTER — Telehealth: Payer: Self-pay | Admitting: Obstetrics and Gynecology

## 2018-11-06 ENCOUNTER — Encounter: Payer: Self-pay | Admitting: Obstetrics and Gynecology

## 2018-11-06 LAB — TYPE AND SCREEN
ABO/RH(D): O POS
Antibody Screen: NEGATIVE
Unit division: 0
Unit division: 0

## 2018-11-06 LAB — BPAM RBC
Blood Product Expiration Date: 202007182359
Blood Product Expiration Date: 202007182359
ISSUE DATE / TIME: 202006131101
Unit Type and Rh: 5100
Unit Type and Rh: 5100

## 2018-11-06 LAB — PREPARE RBC (CROSSMATCH)

## 2018-11-06 NOTE — Telephone Encounter (Signed)
Patient is schedule 11/08/18

## 2018-11-06 NOTE — Telephone Encounter (Signed)
-----   Message from [name], MD sent at 11/04/2018  1:38 PM EDT ----- Regarding: Follow up Needs postop check with me 6/18 or 6/19   [name], MD, [place] 11/04/2018, 1:39 PM

## 2018-11-07 LAB — SURGICAL PATHOLOGY

## 2018-11-08 ENCOUNTER — Ambulatory Visit (INDEPENDENT_AMBULATORY_CARE_PROVIDER_SITE_OTHER): Admitting: Obstetrics and Gynecology

## 2018-11-08 ENCOUNTER — Encounter: Payer: Self-pay | Admitting: Obstetrics and Gynecology

## 2018-11-08 ENCOUNTER — Other Ambulatory Visit: Payer: Self-pay

## 2018-11-08 VITALS — BP 110/68 | HR 104 | Ht 66.0 in | Wt 124.0 lb

## 2018-11-08 DIAGNOSIS — Z4889 Encounter for other specified surgical aftercare: Secondary | ICD-10-CM

## 2018-11-08 MED ORDER — HYDROXYZINE HCL 25 MG PO TABS: 25.0000 mg | ORAL_TABLET | Freq: Four times a day (QID) | ORAL | 2 refills | Status: DC | PRN

## 2018-11-08 NOTE — Progress Notes (Signed)
Postoperative Follow-up Patient presents post op from [facility] 1weeks ago for retained placenta.  Subjective: Patient reports marked improvement in her preop symptoms. Eating a regular diet without difficulty. The patient is not having any pain.  Activity: normal activities of daily living.  Has noted some constipation last bowl movement last week.  Finished methergine yesterday.  Bleeding picked up a little after finishing methergine but still relatively light, no clots.  Objective: Blood pressure 110/68, pulse (!) 104, height 5\' 6"  (1.676 m), weight 124 lb (56.2 kg), currently breastfeeding.  General: NAD Pulmonary: no increased work of breathing Abdomen: soft, non-tender, non-distended, incision(s) D/C/I Extremities: no edema Neurologic: normal gait    Admission on 11/03/2018, Discharged on 11/04/2018  Component Date Value Ref Range Status  . ABO/RH(D) 11/03/2018 O POS   Final  . Antibody Screen 11/03/2018 NEG   Final  . Sample Expiration 11/03/2018 11/06/2018,2359   Final  . Unit Number 11/03/2018 [id number]   Final  . Blood Component Type 11/03/2018 RED CELLS,LR   Final  . Unit division 11/03/2018 00   Final  . Status of Unit 11/03/2018 ISSUED,FINAL   Final  . Transfusion Status 11/03/2018 OK TO TRANSFUSE   Final  . Crossmatch Result 11/03/2018 Compatible   Final  . Unit Number 11/03/2018 [id number]   Final  . Blood Component Type 11/03/2018 RBC LR PHER1   Final  . Unit division 11/03/2018 00   Final  . Status of Unit 11/03/2018 REL FROM [facility]   Final  . Transfusion Status 11/03/2018 OK TO TRANSFUSE   Final  . Crossmatch Result 11/03/2018    Final                   Value:Compatible Performed at [facility], [address], [place], [place] [address]   . WBC 11/03/2018 9.9  4.0 - 10.5 K/uL Final  . RBC 11/03/2018 3.80* 3.87 - 5.11 MIL/uL Final  . Hemoglobin 11/03/2018 12.0  12.0 - 15.0 g/dL Final  . HCT 11/03/2018 35.5* 36.0 - 46.0 % Final  . MCV  11/03/2018 93.4  80.0 - 100.0 fL Final  . MCH 11/03/2018 31.6  26.0 - 34.0 pg Final  . MCHC 11/03/2018 33.8  30.0 - 36.0 g/dL Final  . RDW 11/03/2018 12.3  11.5 - 15.5 % Final  . Platelets 11/03/2018 319  150 - 400 K/uL Final  . nRBC 11/03/2018 0.0  0.0 - 0.2 % Final   Performed at [facility], [address], [place], [place] [address]  . Sodium 11/03/2018 140  135 - 145 mmol/L Final  . Potassium 11/03/2018 3.8  3.5 - 5.1 mmol/L Final  . Chloride 11/03/2018 106  98 - 111 mmol/L Final  . CO2 11/03/2018 25  22 - 32 mmol/L Final  . Glucose, Bld 11/03/2018 95  70 - 99 mg/dL Final  . BUN 11/03/2018 16  6 - 20 mg/dL Final  . Creatinine, Ser 11/03/2018 0.62  0.44 - 1.00 mg/dL Final  . Calcium 11/03/2018 9.2  8.9 - 10.3 mg/dL Final  . GFR calc non Af Amer 11/03/2018 >60  >60 mL/min Final  . GFR calc Af Amer 11/03/2018 >60  >60 mL/min Final  . Anion gap 11/03/2018 9  5 - 15 Final   Performed at [facility], [address] [place], [place] [address]  . SARS Coronavirus 2 11/03/2018 NEGATIVE  NEGATIVE Final   Comment: (NOTE) If result is NEGATIVE SARS-CoV-2 target nucleic acids are NOT  DETECTED. The SARS-CoV-2 RNA is generally detectable in upper and lower  respiratory specimens during the acute phase of infection. The lowest  concentration of SARS-CoV-2 viral copies this assay can detect is 250  copies / mL. A negative result does not preclude SARS-CoV-2 infection  and should not be used as the sole basis for treatment or other  patient management decisions.  A negative result may occur with  improper specimen collection / handling, submission of specimen other  than nasopharyngeal swab, presence of viral mutation(s) within the  areas targeted by this assay, and inadequate number of viral copies  (<250 copies / mL). A negative result must be combined with clinical  observations, patient history, and epidemiological information. If result is POSITIVE SARS-CoV-2 target  nucleic acids are DETECTED. The SARS-CoV-2 RNA is generally detectable in upper and lower  respiratory specimens dur                          ing the acute phase of infection.  Positive  results are indicative of active infection with SARS-CoV-2.  Clinical  correlation with patient history and other diagnostic information is  necessary to determine patient infection status.  Positive results do  not rule out bacterial infection or co-infection with other viruses. If result is PRESUMPTIVE POSTIVE SARS-CoV-2 nucleic acids MAY BE PRESENT.   A presumptive positive result was obtained on the submitted specimen  and confirmed on repeat testing.  While 2019 novel coronavirus  (SARS-CoV-2) nucleic acids may be present in the submitted sample  additional confirmatory testing may be necessary for epidemiological  and / or clinical management purposes  to differentiate between  SARS-CoV-2 and other Sarbecovirus currently known to infect humans.  If clinically indicated additional testing with an alternate test  methodology [phone]) is advised. The SARS-CoV-2 RNA is generally  detectable in upper and lower respiratory sp                          ecimens during the acute  phase of infection. The expected result is Negative. Fact Sheet for Patients:  StrictlyIdeas.no Fact Sheet for Healthcare Providers: BankingDealers.co.za This test is not yet approved or cleared by the [place] FDA and has been authorized for detection and/or diagnosis of SARS-CoV-2 by FDA under an Emergency Use Authorization (EUA).  This EUA will remain in effect (meaning this test can be used) for the duration of the COVID-19 declaration under Section 564(b)(1) of the Act, 21 U.S.C. section 360bbb-3(b)(1), unless the authorization is terminated or revoked sooner. Performed at [facility], [address], [place], [place] [address]   . Order Confirmation  11/03/2018    Final                   Value:ORDER PROCESSED BY BLOOD BANK Performed at [facility], [address] [place], [place] [address]   . ISSUE DATE / TIME 11/03/2018 [id number]   Final  . Blood Product Unit Number 11/03/2018 [id number]   Final  . PRODUCT CODE 11/03/2018 [id number]   Final  . Unit Type and Rh 11/03/2018 5100   Final  . Blood Product Expiration Date 11/03/2018 [id number]   Final  . Blood Product Unit Number 11/03/2018 [id number]   Final  . PRODUCT CODE 11/03/2018 [id number]   Final  . Unit Type and Rh 11/03/2018 5100   Final  . Blood Product Expiration Date 11/03/2018 [id number]   Final  .  SURGICAL PATHOLOGY 11/03/2018    Final                   Value:Surgical Pathology CASE: [id number] PATIENT: [name] Surgical Pathology Report     SPECIMEN SUBMITTED: A. Endometrial curettings  CLINICAL HISTORY: Delayed postpartum hemorrhage following uncomplicated vaginal delivery 10/06/2018, concern for retained placenta on ultrasound  PRE-OPERATIVE DIAGNOSIS: Retained placenta  POST-OPERATIVE DIAGNOSIS: Same as pre-op     DIAGNOSIS: A. ENDOMETRIUM; CURETTAGE: - MULTIPLE PLACENTAL FRAGMENTS WITH DEGENERATION, CONSISTENT WITH RETAINED PLACENTA. - CHRONIC ENDOMETRITIS AND ENDOCERVICITIS.  GROSS DESCRIPTION: A. Labeled: Endometrial curettings Received: Formalin Tissue fragment(s): Multiple Size: Aggregate, 7.2 x 4.5 x 1.0 cm Description: Received are irregular fragments of tan soft tissue admixed with hemorrhagic material.  No distinct placental parenchyma is grossly identified.  No abnormalities are grossly identified. Entirely submitted in cassettes 1-10.   Final Diagnosis performed by Bryan Lemma, Artist Pais   Electronically signed 11/07/2018 4:32:18PM The electronic signature indicates that the named Attending Pathologist has evaluated the specimen  Technical component performed at [facility], [address] [place], [place] [address] Lab: [phone] Dir: Rush Farmer, MD, MMM  Professional component performed at [facility], [place], [place], [place] [address] Lab: [phone] Dir: Dellia Nims. Rubinas, MD   . WBC 11/04/2018 9.0  4.0 - 10.5 K/uL Final  . RBC 11/04/2018 3.05* 3.87 - 5.11 MIL/uL Final  . Hemoglobin 11/04/2018 9.5* 12.0 - 15.0 g/dL Final  . HCT 11/04/2018 29.6* 36.0 - 46.0 % Final  . MCV 11/04/2018 97.0  80.0 - 100.0 fL Final  . MCH 11/04/2018 31.1  26.0 - 34.0 pg Final  . MCHC 11/04/2018 32.1  30.0 - 36.0 g/dL Final  . RDW 11/04/2018 12.4  11.5 - 15.5 % Final  . Platelets 11/04/2018 270  150 - 400 K/uL Final  . nRBC 11/04/2018 0.0  0.0 - 0.2 % Final   Performed at [facility], [address], [place], [place] [address]  . WBC 11/04/2018 7.9  4.0 - 10.5 K/uL Final  . RBC 11/04/2018 2.69* 3.87 - 5.11 MIL/uL Final  . Hemoglobin 11/04/2018 8.6* 12.0 - 15.0 g/dL Final  . HCT 11/04/2018 25.5* 36.0 - 46.0 % Final  . MCV 11/04/2018 94.8  80.0 - 100.0 fL Final  . MCH 11/04/2018 32.0  26.0 - 34.0 pg Final  . MCHC 11/04/2018 33.7  30.0 - 36.0 g/dL Final  . RDW 11/04/2018 12.3  11.5 - 15.5 % Final  . Platelets 11/04/2018 238  150 - 400 K/uL Final  . nRBC 11/04/2018 0.0  0.0 - 0.2 % Final   Performed at [facility], [address], [place], [place] [address]  . Glucose-Capillary 11/04/2018 151* 70 - 99 mg/dL Final    Assessment: 28 y.o. s/p D&C for retained placenta stable  Plan: Patient has done well after surgery with no apparent complications.  I have discussed the post-operative course to date, and the expected progress moving forward.  The patient understands what complications to be concerned about.  I will see the patient in routine follow up, or sooner if needed.    Activity plan: no intercourse for 2 more weeks  Interested in POP for contraception  Has felt some increased anxiety understandable given recent  emergent procedure.  Rx vistaril prn.    Decreased milk supply discussed  continued pumping, if fails to increase discussed option of reglan  Constipation - prn mira lax, colac, may consider enema  Return in about 2 weeks (around 11/22/2018) for 6 week postpartum.    [name], MD, [facility], [facility] 11/08/2018, 8:26 AM

## 2018-11-22 ENCOUNTER — Ambulatory Visit (INDEPENDENT_AMBULATORY_CARE_PROVIDER_SITE_OTHER): Admitting: Obstetrics and Gynecology

## 2018-11-22 ENCOUNTER — Encounter: Payer: Self-pay | Admitting: Obstetrics and Gynecology

## 2018-11-22 ENCOUNTER — Other Ambulatory Visit (HOSPITAL_COMMUNITY)
Admission: RE | Admit: 2018-11-22 | Discharge: 2018-11-22 | Disposition: A | Discharge status: Home / Self Care | Source: Ambulatory Visit | Attending: Obstetrics and Gynecology | Admitting: Obstetrics and Gynecology

## 2018-11-22 ENCOUNTER — Other Ambulatory Visit: Payer: Self-pay

## 2018-11-22 DIAGNOSIS — Z124 Encounter for screening for malignant neoplasm of cervix: Secondary | ICD-10-CM | POA: Diagnosis present

## 2018-11-22 DIAGNOSIS — Z1389 Encounter for screening for other disorder: Secondary | ICD-10-CM

## 2018-11-22 DIAGNOSIS — O99345 Other mental disorders complicating the puerperium: Secondary | ICD-10-CM

## 2018-11-22 DIAGNOSIS — F53 Postpartum depression: Secondary | ICD-10-CM

## 2018-11-22 MED ORDER — ESCITALOPRAM OXALATE 10 MG PO TABS: 10.0000 mg | ORAL_TABLET | Freq: Every day | ORAL | 3 refills | Status: DC

## 2018-11-22 MED ORDER — NORETHINDRONE 0.35 MG PO TABS: 1.0000 | ORAL_TABLET | Freq: Every day | ORAL | 11 refills | Status: DC

## 2018-11-22 NOTE — Progress Notes (Signed)
Postpartum Visit  Chief Complaint:  Chief Complaint  Patient presents with  . Postpartum Care    History of Present Illness: Patient is a 28 y.o. G1P1001 presents for postpartum visit. Date of delivery: .ob Type of delivery: Vaginal delivery - Vacuum or forceps assisted  no Episiotomy No.  Laceration: no  Pregnancy or labor problems:  no Any problems since the delivery:  yes  Newborn Details:  SINGLETON :  1. BabyGender female. Maternal Details:  Breast or formula feeding: plans to breastfeed Intercourse: No  Contraception after delivery: No  Any bowel or bladder issues: No  Post partum depression/anxiety noted:  yes Edinburgh Post-Partum Depression Score:14 Date of last PAP: 02/13/2018 ASCUS with POSITIVE high risk HPV   Review of Systems: Review of Systems  Constitutional: Negative.   Neurological: Positive for headaches.  Psychiatric/Behavioral: Positive for depression. Negative for hallucinations, memory loss, substance abuse and suicidal ideas. The patient is nervous/anxious and has insomnia.     The following portions of the patient's history were reviewed and updated as appropriate: allergies, current medications, past family history, past medical history, past social history, past surgical history and problem list.  Past Medical History:  Past Medical History:  Diagnosis Date  . Anxiety   . Depression     Past Surgical History:  Past Surgical History:  Procedure Laterality Date  . APPENDECTOMY    . DILATION AND CURETTAGE OF UTERUS N/A 11/03/2018   Procedure: DILATATION AND CURETTAGE;  Surgeon: [name], MD;  Location: [facility] ORS;  Service: Gynecology;  Laterality: N/A;  . WISDOM TOOTH EXTRACTION      Family History:  Family History  Problem Relation Age of Onset  . Endometriosis Sister     Social History:  Social History   Socioeconomic History  . Marital status: Married    Spouse name: RJ  . Number of children: Not on file  . Years of  education: Not on file  . Highest education level: Some college, no degree  Occupational History  . Not on file  Social Needs  . Financial resource strain: Not hard at all  . Food insecurity    Worry: Never true    Inability: Never true  . Transportation needs    Medical: No    Non-medical: No  Tobacco Use  . Smoking status: Never Smoker  . Smokeless tobacco: Never Used  Substance and Sexual Activity  . Alcohol use: Never    Frequency: Never  . Drug use: Never  . Sexual activity: Not Currently    Comment: undecided  Lifestyle  . Physical activity    Days per week: 0 days    Minutes per session: 0 min  . Stress: To some extent  Relationships  . Social connections    Talks on phone: More than three times a week    Gets together: Once a week    Attends religious service: More than 4 times per year    Active member of club or organization: No    Attends meetings of clubs or organizations: Never    Relationship status: Married  . Intimate partner violence    Fear of current or ex partner: No    Emotionally abused: No    Physically abused: No    Forced sexual activity: No  Other Topics Concern  . Not on file  Social History Narrative  . Not on file    Allergies:  No Known Allergies  Medications: Prior to Admission medications  Medication Sig Start Date End Date Taking? Authorizing Provider  Magnesium 100 MG CAPS Take by mouth.   Yes [provider]  Prenat w/o A Vit-FeFum-FePo-FA (CONCEPT OB) 130-92.4-1 MG CAPS Take 1 capsule by mouth daily. 02/10/18  Yes [name], MD  ferrous sulfate (FERROUSUL) 325 (65 FE) MG tablet Take 1 tablet (325 mg total) by mouth daily. Patient not taking: Reported on 11/22/2018 11/04/18   [name], MD  HYDROcodone-acetaminophen (NORCO/VICODIN) 5-325 MG tablet Take 1 tablet by mouth every 6 (six) hours as needed. Patient not taking: Reported on 11/22/2018 11/04/18   [name], MD  hydrOXYzine (ATARAX/VISTARIL)  25 MG tablet Take 1 tablet (25 mg total) by mouth every 6 (six) hours as needed for anxiety. Patient not taking: Reported on 11/22/2018 11/08/18   [name], MD  ibuprofen (ADVIL) 600 MG tablet Take 1 tablet (600 mg total) by mouth every 6 (six) hours. Patient not taking: Reported on 11/22/2018 10/08/18   Homero Fellers, MD    Physical Exam Blood pressure 100/70, height 5\' 6"  (1.676 m), weight 121 lb (54.9 kg), currently breastfeeding.    General: NAD HEENT: normocephalic, anicteric Pulmonary: No increased work of breathing Abdomen: NABS, soft, non-tender, non-distended.  Umbilicus without lesions.  No hepatomegaly, splenomegaly or masses palpable. No evidence of hernia. Genitourinary:  External: Normal external female genitalia.  Normal urethral meatus, normal  Bartholin's and Skene's glands.    Vagina: Normal vaginal mucosa, no evidence of prolapse.    Cervix: Grossly normal in appearance, no bleeding  Uterus: Non-enlarged, mobile, normal contour.  No CMT  Adnexa: ovaries non-enlarged, no adnexal masses  Rectal: deferred Extremities: no edema, erythema, or tenderness Neurologic: Grossly intact Psychiatric: mood appropriate, affect full  Edinburgh Postnatal Depression Scale - 11/22/18 1100      Edinburgh Postnatal Depression Scale:  In the Past 7 Days   I have been able to laugh and see the funny side of things.  0    I have looked forward with enjoyment to things.  0    I have blamed myself unnecessarily when things went wrong.  3    I have been anxious or worried for no good reason.  3    I have felt scared or panicky for no good reason.  3    Things have been getting on top of me.  1    I have been so unhappy that I have had difficulty sleeping.  2    I have felt sad or miserable.  1    I have been so unhappy that I have been crying.  1    The thought of harming myself has occurred to me.  0    Edinburgh Postnatal Depression Scale Total  14       Edinburgh  Postnatal Depression Scale - 11/22/18 1100      Edinburgh Postnatal Depression Scale:  In the Past 7 Days   I have been able to laugh and see the funny side of things.  0    I have looked forward with enjoyment to things.  0    I have blamed myself unnecessarily when things went wrong.  3    I have been anxious or worried for no good reason.  3    I have felt scared or panicky for no good reason.  3    Things have been getting on top of me.  1    I have been so unhappy that I have had difficulty  sleeping.  2    I have felt sad or miserable.  1    I have been so unhappy that I have been crying.  1    The thought of harming myself has occurred to me.  0    Edinburgh Postnatal Depression Scale Total  14       Assessment: 28 y.o. G1P1001 presenting for 6 week postpartum visit  Plan: Problem List Items Addressed This Visit    None    Visit Diagnoses    6 weeks postpartum follow-up    -  Primary   Relevant Orders   Cytology - PAP   Screening for malignant neoplasm of cervix       Relevant Orders   Cytology - PAP   Postpartum depression       Relevant Medications   escitalopram (LEXAPRO) 10 MG tablet       1) Contraception - Education given regarding options for contraception, as well as compatibility with breast feeding if applicable.  Patient plans on oral progesterone-only contraceptive for contraception.  2)  Pap - ASCCP guidelines and rational discussed.  ASCCP guidelines and rational discussed.  Patient opts for every 3 years screening interval  3) Patient underwent screening for postpartum depression  - start lexparo 10mg    4) Return in about 2 weeks (around 12/06/2018) for Medication follow up (telephone).   [name], MD, [facility], [facility] 11/22/2018, 11:13 AM

## 2018-11-28 LAB — CYTOLOGY - PAP
Diagnosis: UNDETERMINED — AB
HPV: DETECTED — AB

## 2018-12-04 NOTE — Progress Notes (Signed)
Needs her visit coming up changed to medication follow AND colposcopy

## 2018-12-05 ENCOUNTER — Telehealth: Payer: Self-pay | Admitting: Obstetrics and Gynecology

## 2018-12-05 NOTE — Telephone Encounter (Signed)
-----   Message from [name], MD sent at 12/04/2018  5:53 PM EDT ----- Needs her visit coming up changed to medication follow AND colposcopy

## 2018-12-05 NOTE — Telephone Encounter (Signed)
Patient is schedule 12/26/18 with AMS

## 2018-12-05 NOTE — Telephone Encounter (Signed)
Called and left voicemail to call back to be schedule for Colpo

## 2018-12-08 ENCOUNTER — Ambulatory Visit: Admitting: Obstetrics and Gynecology

## 2018-12-11 ENCOUNTER — Ambulatory Visit: Admitting: Obstetrics and Gynecology

## 2018-12-15 ENCOUNTER — Other Ambulatory Visit: Payer: Self-pay | Admitting: Obstetrics and Gynecology

## 2018-12-15 DIAGNOSIS — G43009 Migraine without aura, not intractable, without status migrainosus: Secondary | ICD-10-CM

## 2018-12-15 MED ORDER — BUTALBITAL-APAP-CAFFEINE 50-325-40 MG PO CAPS: 1.0000 | ORAL_CAPSULE | Freq: Four times a day (QID) | ORAL | 3 refills | Status: DC | PRN

## 2018-12-26 ENCOUNTER — Other Ambulatory Visit: Payer: Self-pay

## 2018-12-26 ENCOUNTER — Other Ambulatory Visit (HOSPITAL_COMMUNITY)
Admission: RE | Admit: 2018-12-26 | Discharge: 2018-12-26 | Disposition: A | Discharge status: Home / Self Care | Source: Ambulatory Visit | Attending: Obstetrics and Gynecology | Admitting: Obstetrics and Gynecology

## 2018-12-26 ENCOUNTER — Ambulatory Visit (INDEPENDENT_AMBULATORY_CARE_PROVIDER_SITE_OTHER): Admitting: Obstetrics and Gynecology

## 2018-12-26 ENCOUNTER — Encounter: Payer: Self-pay | Admitting: Obstetrics and Gynecology

## 2018-12-26 VITALS — BP 102/70 | HR 93 | Wt 119.0 lb

## 2018-12-26 DIAGNOSIS — R87610 Atypical squamous cells of undetermined significance on cytologic smear of cervix (ASC-US): Secondary | ICD-10-CM | POA: Diagnosis not present

## 2018-12-26 DIAGNOSIS — R87810 Cervical high risk human papillomavirus (HPV) DNA test positive: Secondary | ICD-10-CM | POA: Insufficient documentation

## 2018-12-26 DIAGNOSIS — F53 Postpartum depression: Secondary | ICD-10-CM | POA: Diagnosis not present

## 2018-12-26 DIAGNOSIS — N870 Mild cervical dysplasia: Secondary | ICD-10-CM

## 2018-12-26 DIAGNOSIS — O99345 Other mental disorders complicating the puerperium: Secondary | ICD-10-CM | POA: Diagnosis not present

## 2018-12-26 MED ORDER — ESCITALOPRAM OXALATE 10 MG PO TABS: 10.0000 mg | ORAL_TABLET | Freq: Every day | ORAL | 11 refills | Status: DC

## 2018-12-26 NOTE — Progress Notes (Signed)
_     Obstetrics & Gynecology Office Visit   Chief Complaint:  Chief Complaint  Patient presents with  . Medication follow up    PP depression  . Colposcopy    History of Present Illness:Vickie Manning is a 28 y.o. woman who presents today for continued surveillance for history of dysplasia. Last pap obtained on 11/22/2018 revealed ASCUS HPV positive.  Prior pap 02/13/2018 ASCUS HPV positive.  The patient is a 28 y.o. female presenting follow up for symptoms of anxiety and depression.  The patient is currently taking Lexapro 10mg  for the management of her symptoms.  She has not had any recent situational stressors other than postpartum period.  She reports resolution of symptoms.  She denies anhedonia, day time somnolence, insomnia, risk taking behavior, irritability, increased appetite, decreased appetite, social anxiety, agorophobia, feelings of guilt, feelings of worthlessness, suicidal ideation, homicidal ideation, auditory hallucinations and visual hallucinations. Symptoms have improved since last visit.     Review of Systems: Review of Systems  Constitutional: Negative.   Genitourinary: Negative.   Skin: Negative.   Psychiatric/Behavioral: Negative for depression, hallucinations, memory loss, substance abuse and suicidal ideas. The patient is not nervous/anxious and does not have insomnia.    Past Medical History:  Past Medical History:  Diagnosis Date  . Anxiety   . Depression     Past Surgical History:  Past Surgical History:  Procedure Laterality Date  . APPENDECTOMY    . DILATION AND CURETTAGE OF UTERUS N/A 11/03/2018   Procedure: DILATATION AND CURETTAGE;  Surgeon: [name], MD;  Location: [facility] ORS;  Service: Gynecology;  Laterality: N/A;  . WISDOM TOOTH EXTRACTION      Gynecologic History: No LMP recorded.  Obstetric History: G1P1001  Family History:  Family History  Problem Relation Age of Onset  . Endometriosis Sister     Social History:  Social  History   Socioeconomic History  . Marital status: Married    Spouse name: RJ  . Number of children: Not on file  . Years of education: Not on file  . Highest education level: Some college, no degree  Occupational History  . Not on file  Social Needs  . Financial resource strain: Not hard at all  . Food insecurity    Worry: Never true    Inability: Never true  . Transportation needs    Medical: No    Non-medical: No  Tobacco Use  . Smoking status: Never Smoker  . Smokeless tobacco: Never Used  Substance and Sexual Activity  . Alcohol use: Never    Frequency: Never  . Drug use: Never  . Sexual activity: Not Currently    Comment: undecided  Lifestyle  . Physical activity    Days per week: 0 days    Minutes per session: 0 min  . Stress: To some extent  Relationships  . Social connections    Talks on phone: More than three times a week    Gets together: Once a week    Attends religious service: More than 4 times per year    Active member of club or organization: No    Attends meetings of clubs or organizations: Never    Relationship status: Married  . Intimate partner violence    Fear of current or ex partner: No    Emotionally abused: No    Physically abused: No    Forced sexual activity: No  Other Topics Concern  . Not on file  Social History Narrative  .  Not on file    Allergies:  No Known Allergies  Medications: Prior to Admission medications   Medication Sig Start Date End Date Taking? Authorizing Provider  Butalbital-APAP-Caffeine 50-325-40 MG capsule Take 1 capsule by mouth every 6 (six) hours as needed for headache. 12/15/18  Yes Schuman, Christanna R, MD  escitalopram (LEXAPRO) 10 MG tablet Take 1 tablet (10 mg total) by mouth daily. 12/26/18  Yes [name], MD  hydrOXYzine (ATARAX/VISTARIL) 25 MG tablet Take 1 tablet (25 mg total) by mouth every 6 (six) hours as needed for anxiety. 11/08/18  Yes [name], MD  Magnesium 100 MG CAPS Take by  mouth.   Yes [provider]  norethindrone (MICRONOR) 0.35 MG tablet Take 1 tablet (0.35 mg total) by mouth daily. 11/22/18  Yes [name], MD  Prenat w/o A Vit-FeFum-FePo-FA (CONCEPT OB) 130-92.4-1 MG CAPS Take 1 capsule by mouth daily. 02/10/18  Yes [name], MD    Physical Exam Vitals:  Vitals:   12/26/18 1054  BP: 102/70  Pulse: 93   No LMP recorded.  General: NAD, well nourished, appears stated age 57: normocephalic, anicteric Pulmonary: No increased work of breathing Genitourinary:  External: Normal external female genitalia.  Normal urethral meatus, normal  Bartholin's and Skene's glands.    Vagina: Normal vaginal mucosa, no evidence of prolapse.    Cervix: Grossly normal in appearance, no bleeding  Uterus: Non-enlarged, mobile, normal contour.  No CMT  Adnexa: ovaries non-enlarged, no adnexal masses  Rectal: deferred  Lymphatic: no evidence of inguinal lymphadenopathy Extremities: no edema, erythema, or tenderness Neurologic: Grossly intact Psychiatric: mood appropriate, affect full  Female chaperone present for pelvic and breast  portions of the physical exam   GYNECOLOGY CLINIC COLPOSCOPY PROCEDURE NOTE  28 y.o. G1P1001 here for colposcopy for ASCUS with POSITIVE high risk HPV  pap smear. Discussed underlying role for HPV infection in the development of cervical dysplasia, its natural history and progression/regression, need for surveillance.  Is the patient  pregnant: No LMP: No LMP recorded. Smoking status:  reports that she has never smoked. She has never used smokeless tobacco. Contraception: oral progesterone-only contraceptive Number current sexual partners: 1 Future fertility desired:  Yes  Patient given informed consent, signed copy in the chart, time out was performed.  The patient was position in dorsal lithotomy position. Speculum was placed the cervix was visualized.   After application of acetic acid colposcopic inspection  of the cervix was undertaken.   Colposcopy adequate, full visualization of transformation zone: Yes no visible lesions; corresponding biopsies obtained.   ECC specimen obtained:  Yes  All specimens were labeled and sent to pathology.   Patient was given post procedure instructions.  Will follow up pathology and manage accordingly.  Routine preventative health maintenance measures emphasized.   Assessment: 28 y.o. G1P1001 follow up for postpartum depression, ASCUS HPV positive  Plan: Problem List Items Addressed This Visit    None    Visit Diagnoses    ASCUS with positive high risk HPV cervical    -  Primary   Relevant Orders   Surgical pathology   Postpartum depression       Relevant Medications   escitalopram (LEXAPRO) 10 MG tablet      - Follow up pap smear from today.   - I had a lengthly discussion with Shontel Santee  regarding the cause of dysplasia of the lower genital tract (including immunosuppression in the setting of HPV exposure and tobacco exposure). I explained the potential for  progression to invasive malignancy, the recurrent nature of these lesions (and the need for close continued followup). Results of today's pap will dictate need for further evaluation and follow up per ASCCP guidelines..  - She is comfortable with the plan and had her questions answered.  - Doing well as far as lexapro 10mg , maintain current dose.  EPDS down to 6 from 14  - Return in about 1 year (around 12/26/2019) for annual.   [name], MD, [place] 12/26/2018, 11:25 AM

## 2019-02-05 ENCOUNTER — Other Ambulatory Visit: Payer: Self-pay | Admitting: Obstetrics and Gynecology

## 2019-02-23 ENCOUNTER — Other Ambulatory Visit: Payer: Self-pay

## 2019-02-23 ENCOUNTER — Inpatient Hospital Stay
Admission: RE | Admit: 2019-02-23 | Discharge: 2019-02-23 | Disposition: A | Discharge status: Home / Self Care | Source: Ambulatory Visit

## 2019-02-26 ENCOUNTER — Other Ambulatory Visit: Payer: Self-pay

## 2019-02-26 ENCOUNTER — Telehealth: Payer: Self-pay | Admitting: Family Medicine

## 2019-02-26 ENCOUNTER — Ambulatory Visit (INDEPENDENT_AMBULATORY_CARE_PROVIDER_SITE_OTHER): Admitting: Family Medicine

## 2019-02-26 ENCOUNTER — Encounter: Payer: Self-pay | Admitting: Family Medicine

## 2019-02-26 VITALS — BP 98/68 | HR 90 | Temp 97.6°F | Resp 12 | Ht 66.0 in | Wt 114.0 lb

## 2019-02-26 DIAGNOSIS — Z8669 Personal history of other diseases of the nervous system and sense organs: Secondary | ICD-10-CM

## 2019-02-26 DIAGNOSIS — Z23 Encounter for immunization: Secondary | ICD-10-CM | POA: Diagnosis not present

## 2019-02-26 DIAGNOSIS — G43009 Migraine without aura, not intractable, without status migrainosus: Secondary | ICD-10-CM

## 2019-02-26 MED ORDER — BUTALBITAL-APAP-CAFFEINE 50-325-40 MG PO TABS: 1.0000 | ORAL_TABLET | Freq: Four times a day (QID) | ORAL | 0 refills | Status: DC | PRN

## 2019-02-26 MED ORDER — AIMOVIG 70 MG/ML ~~LOC~~ SOAJ: 70.0000 mg | SUBCUTANEOUS | 3 refills | Status: DC

## 2019-02-26 NOTE — Progress Notes (Signed)
Subjective:    Patient ID: Vickie Manning, female    DOB: 26-Feb-1991, 28 y.o.   MRN: [id number]  HPI Patient is a very pleasant 28 year old Caucasian female here today to discuss treatment options for her migraines.  She has had migraines the majority of her life since adolescence.  She states that she gets them 3-5 times per week!.  She has nausea, photophobia, phonophobia.  She often awakens with a headache.  It is a pounding pulsatile headache.  She has been taking Fioricet when she gets the headache and this will often abort the headache.  However she is using quite a bit of Fioricet every month to help manage the headaches.  Her biggest concern is that she is breast-feeding and she is worried about the amount of aspirin and butalbital that could be getting into her breast milk.  Her son is 23 months old.  Plans are to continue breast-feeding until he is eating solid food Past Medical History:  Diagnosis Date  . Anxiety   . Depression    Past Surgical History:  Procedure Laterality Date  . APPENDECTOMY    . DILATION AND CURETTAGE OF UTERUS N/A 11/03/2018   Procedure: DILATATION AND CURETTAGE;  Surgeon: [name], MD;  Location: [facility] ORS;  Service: Gynecology;  Laterality: N/A;  . WISDOM TOOTH EXTRACTION     Current Outpatient Medications on File Prior to Visit  Medication Sig Dispense Refill  . Butalbital-APAP-Caffeine 50-325-40 MG capsule Take 1 capsule by mouth every 6 (six) hours as needed for headache. 30 capsule 3  . escitalopram (LEXAPRO) 10 MG tablet Take 1 tablet (10 mg total) by mouth daily. 30 tablet 11  . hydrOXYzine (ATARAX/VISTARIL) 25 MG tablet Take 1 tablet (25 mg total) by mouth every 6 (six) hours as needed for anxiety. 30 tablet 2  . Magnesium 100 MG CAPS Take by mouth.    . norethindrone (MICRONOR) 0.35 MG tablet Take 1 tablet (0.35 mg total) by mouth daily. 1 Package 11  . Prenat w/o A Vit-FeFum-FePo-FA (CONCEPT OB) 130-92.4-1 MG CAPS Take 1 capsule by mouth  daily. 30 capsule 11   No current facility-administered medications on file prior to visit.    No Known Allergies Social History   Socioeconomic History  . Marital status: Married    Spouse name: RJ  . Number of children: Not on file  . Years of education: Not on file  . Highest education level: Some college, no degree  Occupational History  . Not on file  Social Needs  . Financial resource strain: Not hard at all  . Food insecurity    Worry: Never true    Inability: Never true  . Transportation needs    Medical: No    Non-medical: No  Tobacco Use  . Smoking status: Never Smoker  . Smokeless tobacco: Never Used  Substance and Sexual Activity  . Alcohol use: Never    Frequency: Never  . Drug use: Never  . Sexual activity: Not Currently    Comment: undecided  Lifestyle  . Physical activity    Days per week: 0 days    Minutes per session: 0 min  . Stress: To some extent  Relationships  . Social connections    Talks on phone: More than three times a week    Gets together: Once a week    Attends religious service: More than 4 times per year    Active member of club or organization: No    Attends meetings of  clubs or organizations: Never    Relationship status: Married  . Intimate partner violence    Fear of current or ex partner: No    Emotionally abused: No    Physically abused: No    Forced sexual activity: No  Other Topics Concern  . Not on file  Social History Narrative  . Not on file     Review of Systems  All other systems reviewed and are negative.      Objective:   Physical Exam Constitutional:      Appearance: Normal appearance. She is normal weight.  Cardiovascular:     Rate and Rhythm: Normal rate and regular rhythm.     Pulses: Normal pulses.     Heart sounds: Normal heart sounds.  Pulmonary:     Effort: Pulmonary effort is normal. No respiratory distress.     Breath sounds: Normal breath sounds. No stridor. No wheezing, rhonchi or rales.   Neurological:     General: No focal deficit present.     Mental Status: She is alert and oriented to person, place, and time. Mental status is at baseline.     Cranial Nerves: No cranial nerve deficit.     Sensory: No sensory deficit.     Motor: No weakness.     Coordination: Coordination normal.     Gait: Gait normal.     Deep Tendon Reflexes: Reflexes normal.           Assessment & Plan:  History of migraine headaches  Spent 25 minutes today with the patient discussing her options.  I believe we need to focus on abortive medication because of the frequency that she is experiencing migraines.  However as I explained to the patient every option for abortive medication that I can think of does transfer into breastmilk.  Propranolol can potentially cause low heart rate in the baby, Topamax can cause birth defects if she were to get pregnant and could also cause sedation for the baby, Aimovig, as I explained to the patient, has not been extensively studied in lactation although no adverse effects are reported.  Abortive medications also will show up in breastmilk such as Imitrex, Fioricet.  Patient has tried ibuprofen with very little success.  Therefore as I explained to the patient, I believe the benefit outweighs the risk given the frequency that she is having headaches.  I would suggest trying Aimovig 70 mg monthly as a preventative medication.  She could then use/continue to use her Fioricet as needed for the headaches as an abortive but hopefully with less frequency and severity.  Patient will check on this.

## 2019-02-26 NOTE — Telephone Encounter (Signed)
Start topamax 25 mg poqhs for 1 week, then 25 mg pobid for 1 week, then 25 mg in the am and 50 mg in the PM for 1 week, and then 50 mg pobid.  Stop if side effects develop and stay at the dose she tolerates. This will pass into her breast milk.

## 2019-02-26 NOTE — Addendum Note (Signed)
Addended by: Shary Decamp B on: 02/26/2019 09:41 AM   Modules accepted: Orders

## 2019-02-26 NOTE — Telephone Encounter (Signed)
Pt called and Amiovig too expensive pt would like Topamax called into her pharm please.

## 2019-02-27 MED ORDER — TOPIRAMATE 25 MG PO TABS: 50.0000 mg | ORAL_TABLET | Freq: Two times a day (BID) | ORAL | 3 refills | Status: DC

## 2019-02-27 NOTE — Telephone Encounter (Signed)
Pt aware via mychart and med sent to pharm

## 2019-03-02 ENCOUNTER — Telehealth: Payer: Self-pay | Admitting: *Deleted

## 2019-03-02 NOTE — Telephone Encounter (Signed)
Received request from pharmacy for PA on [place]  PA submitted.   Dx: [id number]- Migraine

## 2019-03-02 NOTE — Telephone Encounter (Signed)
[facility] is reviewing your PA request and will respond within 24 hours for Medicaid or up to 72 hours for non-Medicaid plans, based on the required timeframe determined by state or federal regulations. To check for an update later, open this request from your dashboard.

## 2019-03-03 ENCOUNTER — Encounter: Payer: Self-pay | Admitting: Family Medicine

## 2019-03-05 MED ORDER — BUTALBITAL-APAP-CAFFEINE 50-325-40 MG PO TABS: 1.0000 | ORAL_TABLET | Freq: Four times a day (QID) | ORAL | 0 refills | Status: DC | PRN

## 2019-03-05 NOTE — Telephone Encounter (Signed)
Ok to refill??  Last office visit/ refill 02/26/2019.

## 2019-03-07 NOTE — Telephone Encounter (Signed)
Received PA determination.   PA denied.   Awaiting letter for details.

## 2019-03-15 ENCOUNTER — Telehealth: Payer: Self-pay | Admitting: *Deleted

## 2019-03-15 NOTE — Telephone Encounter (Signed)
Received call from patient.   Reports that she was giving self pedicure and noted that she removed part of great toe. Reports no bleeding to area, but states that it is tender. Reports that nail is lifted from nail bed and is trying to detach from cuticle as well.   States that nail was noted yellow and brittle.   Appointment scheduled.

## 2019-03-16 ENCOUNTER — Other Ambulatory Visit: Payer: Self-pay

## 2019-03-16 ENCOUNTER — Ambulatory Visit (INDEPENDENT_AMBULATORY_CARE_PROVIDER_SITE_OTHER): Admitting: Family Medicine

## 2019-03-16 ENCOUNTER — Encounter: Payer: Self-pay | Admitting: Family Medicine

## 2019-03-16 VITALS — BP 98/64 | HR 76 | Temp 98.5°F | Resp 14 | Ht 66.0 in | Wt 112.0 lb

## 2019-03-16 DIAGNOSIS — B351 Tinea unguium: Secondary | ICD-10-CM

## 2019-03-16 MED ORDER — MUPIROCIN CALCIUM 2 % EX CREA: 1.0000 "application " | TOPICAL_CREAM | Freq: Two times a day (BID) | CUTANEOUS | 0 refills | Status: DC

## 2019-03-16 MED ORDER — CICLOPIROX 8 % EX SOLN: Freq: Every day | CUTANEOUS | 2 refills | Status: DC

## 2019-03-16 NOTE — Patient Instructions (Signed)
Use the ointment twice a day Use the penlac epson salt soak  F/U as needed

## 2019-03-16 NOTE — Progress Notes (Signed)
   Subjective:    Patient ID: Vickie Manning, female    DOB: 28-Nov-1990, 28 y.o.   MRN: [id number]  Patient presents for L Great Toe Nail Issues (nail removed from nail bed)   She has bilat yellow tint to the great tonails on both sides.  Her left one was worse than the right.  She notes that it was thickened areas and brittle.  She tried to file it down and the piece of her nail came off in the middle.  She has some mild discomfort with this.  She cleaned it and use antibiotic ointment on it and came in for a visit.  She has not had any pus leaking or active bleeding from the nail. He states that she stumped her toes all the time  Review Of Systems:  GEN- denies fatigue, fever, weight loss,weakness, recent illness HEENT- denies eye drainage, change in vision, nasal discharge, CVS- denies chest pain, palpitations RESP- denies SOB, cough, wheeze MSK- denies joint pain, muscle aches, injury Neuro- denies headache, dizziness, syncope, seizure activity       Objective:    BP 98/64   Pulse 76   Temp 98.5 F (36.9 C) (Oral)   Resp 14   Ht 5\' 6"  (1.676 m)   Wt 112 lb (50.8 kg)   SpO2 98%   BMI 18.08 kg/m  GEN- NAD, alert and oriented x3 Extremities no edema Skin she did have polish on the right foot unable to visualize the entire nail but it great toenail.  Thick.  Left toenail yellow and thick and brittle on the edges she is missing a jagged circular like piece of the nail at the center normal granulation tissue seen beneath no active bleeding no odor no drainage.  Appears has had a very small subungual hematoma adjacent to where the nail is missing,  mild tenderness to palpation of the nail.  Ingrown nail. Pulse dorsalis pedis 2+       Assessment & Plan:      Problem List Items Addressed This Visit    None    Visit Diagnoses    Onychomycosis of great toe    -  Primary   Nail fungus, discussed removing entire nail vs treating the nail even with the whole in center.Opted to keep  nail, advised that the nail is brittle with fungus so if she does hit it or dropped something on it the nail is likely going to break off at that same area up to the tip where she typically with treatment.  Also advised if there is any sign of superinfection such as pus coming from the center she needs to return we will remove the entire nail.  In the meantime since she is breast-feeding her infant I have opted to use topical Penlac for nail fungus on both great toenails discussed how this is used.  We will also give her Bactroban to use in the area where the nail avulsed in the center to keep this from getting infected she can also use Epson salt soaks for any discomfort.  Advised that nail fungus take significant time and she will need to use this topical from months until it improves.   Relevant Medications   mupirocin cream (BACTROBAN) 2 %   ciclopirox (PENLAC) 8 % solution      Note: This dictation was prepared with Dragon dictation along with smaller phrase technology. Any transcriptional errors that result from this process are unintentional.

## 2019-03-20 ENCOUNTER — Other Ambulatory Visit: Payer: Self-pay | Admitting: Obstetrics and Gynecology

## 2019-03-20 MED ORDER — HYDROXYZINE HCL 25 MG PO TABS: 25.0000 mg | ORAL_TABLET | Freq: Four times a day (QID) | ORAL | 2 refills | Status: DC | PRN

## 2019-03-20 MED ORDER — ESCITALOPRAM OXALATE 20 MG PO TABS: 20.0000 mg | ORAL_TABLET | Freq: Every day | ORAL | 11 refills | Status: DC

## 2019-03-20 NOTE — Telephone Encounter (Signed)
scheduled

## 2019-03-20 NOTE — Telephone Encounter (Signed)
Medication follow up 2 week phone or in office

## 2019-03-26 ENCOUNTER — Other Ambulatory Visit: Payer: Self-pay | Admitting: Family Medicine

## 2019-03-26 ENCOUNTER — Encounter: Payer: Self-pay | Admitting: Family Medicine

## 2019-03-27 NOTE — Telephone Encounter (Signed)
Requesting refill    Fioricet  LOV: 02/26/2019  LRF:  02/26/19

## 2019-04-03 ENCOUNTER — Other Ambulatory Visit: Payer: Self-pay

## 2019-04-03 ENCOUNTER — Ambulatory Visit (INDEPENDENT_AMBULATORY_CARE_PROVIDER_SITE_OTHER): Admitting: Obstetrics and Gynecology

## 2019-04-03 DIAGNOSIS — F411 Generalized anxiety disorder: Secondary | ICD-10-CM | POA: Diagnosis not present

## 2019-04-03 MED ORDER — BUSPIRONE HCL 5 MG PO TABS: 5.0000 mg | ORAL_TABLET | Freq: Two times a day (BID) | ORAL | 3 refills | Status: DC

## 2019-04-03 NOTE — Progress Notes (Signed)
I connected with [name] on 04/05/19 at  4:30 PM EST by telephone and verified that I am speaking with the correct person using two identifiers.   I discussed the limitations, risks, security and privacy concerns of performing an evaluation and management service by telephone and the availability of in person appointments. I also discussed with the patient that there may be a patient responsible charge related to this service. The patient expressed understanding and agreed to proceed.  The patient was at home I spoke with the patient from my workstation phone The names of people involved in this encounter were: Vickie Manning , and Vickie Manning   Obstetrics & Gynecology Office Visit   Chief Complaint:  Chief Complaint  Patient presents with  . Follow-up    depression/anxiety postpartum    History of Present Illness: The patient is a 28 y.o. female presenting follow up for symptoms of anxiety and depression.  The patient is currently taking lexapro 20mg  for the management of her symptoms.  She has not had any recent situational stressors.  She reports symptoms of anhedonia, insomnia, irritability and social anxiety.  She denies risk taking behavior, feelings of worthlessness, suicidal ideation, homicidal ideation, auditory hallucinations and visual hallucinations. Symptoms have improved since last visit.     The patient does have a pre-existing history of depression and anxiety.  She  does not a prior history of suicide attempts. Very good control of depression symptoms but continued issues with anxiety  Review of Systems: Review of Systems  Constitutional: Negative.   Gastrointestinal: Negative for nausea and vomiting.  Neurological: Negative for headaches.  Psychiatric/Behavioral: Negative for depression, hallucinations, memory loss, substance abuse and suicidal ideas. The patient is nervous/anxious. The patient does not have insomnia.      Past Medical History:  Past  Medical History:  Diagnosis Date  . Anxiety   . Depression     Past Surgical History:  Past Surgical History:  Procedure Laterality Date  . APPENDECTOMY    . DILATION AND CURETTAGE OF UTERUS N/A 11/03/2018   Procedure: DILATATION AND CURETTAGE;  Surgeon: Vickie Mood, MD;  Location: [facility] ORS;  Service: Gynecology;  Laterality: N/A;  . WISDOM TOOTH EXTRACTION      Gynecologic History: No LMP recorded.  Obstetric History: G1P1001  Family History:  Family History  Problem Relation Age of Onset  . Endometriosis Sister     Social History:  Social History   Socioeconomic History  . Marital status: Married    Spouse name: Vickie Manning  . Number of children: Not on file  . Years of education: Not on file  . Highest education level: Some college, no degree  Occupational History  . Not on file  Social Needs  . Financial resource strain: Not hard at all  . Food insecurity    Worry: Never true    Inability: Never true  . Transportation needs    Medical: No    Non-medical: No  Tobacco Use  . Smoking status: Never Smoker  . Smokeless tobacco: Never Used  Substance and Sexual Activity  . Alcohol use: Never    Frequency: Never  . Drug use: Never  . Sexual activity: Not Currently    Comment: undecided  Lifestyle  . Physical activity    Days per week: 0 days    Minutes per session: 0 min  . Stress: To some extent  Relationships  . Social connections    Talks on phone: More than three times a week  Gets together: Once a week    Attends religious service: More than 4 times per year    Active member of club or organization: No    Attends meetings of clubs or organizations: Never    Relationship status: Married  . Intimate partner violence    Fear of current or ex partner: No    Emotionally abused: No    Physically abused: No    Forced sexual activity: No  Other Topics Concern  . Not on file  Social History Narrative  . Not on file    Allergies:  No Known Allergies   Medications: Prior to Admission medications   Medication Sig Start Date End Date Taking? Authorizing Provider  butalbital-acetaminophen-caffeine (FIORICET) 50-325-40 MG tablet TAKE 1 TO 2 TABLETS BY MOUTH EVERY 6 HOURS AS NEEDED FOR HEADACHE 03/27/19  Yes Susy Frizzle, MD  ciclopirox [facility]) 8 % solution Apply topically at bedtime. Apply over nail and surrounding skin. Apply daily over previous coat. After seven (7) days, may remove with alcohol and continue cycle. 03/16/19  Yes [place], Modena Nunnery, MD  escitalopram (LEXAPRO) 20 MG tablet Take 1 tablet (20 mg total) by mouth daily. 03/20/19  Yes Vickie Mood, MD  hydrOXYzine (ATARAX/VISTARIL) 25 MG tablet Take 1 tablet (25 mg total) by mouth every 6 (six) hours as needed for anxiety. 03/20/19  Yes Vickie Mood, MD  Magnesium 100 MG CAPS Take by mouth.   Yes [provider]  mupirocin cream (BACTROBAN) 2 % Apply 1 application topically 2 (two) times daily. For 1 week to toe 03/16/19  Yes [place], Modena Nunnery, MD  norethindrone (MICRONOR) 0.35 MG tablet Take 1 tablet (0.35 mg total) by mouth daily. 11/22/18  Yes Vickie Mood, MD  Prenat w/o A Vit-FeFum-FePo-FA (CONCEPT OB) 130-92.4-1 MG CAPS Take 1 capsule by mouth daily. 02/10/18  Yes Vickie Mood, MD    Physical Exam Vitals: There were no vitals filed for this visit. No LMP recorded.  No physical exam as this was a remote telephone visit to promote social distancing during the current COVID-19 Pandemic  GAD 7 : Generalized Anxiety Score 04/03/2019  Nervous, Anxious, on Edge 2  Control/stop worrying 2  Worry too much - different things 2  Trouble relaxing 1  Restless 1  Easily annoyed or irritable 0  Afraid - awful might happen 1  Total GAD 7 Score 9  Anxiety Difficulty Somewhat difficult    Depression screen [facility] 2/9 04/03/2019 02/01/2018  Decreased Interest 0 0  Down, Depressed, Hopeless 0 0  PHQ - 2 Score 0 0    Depression screen [facility] 2/9 04/03/2019  02/01/2018  Decreased Interest 0 0  Down, Depressed, Hopeless 0 0  PHQ - 2 Score 0 0     Assessment: 28 y.o. G1P1001 follow up anxiety and depression  Plan: Problem List Items Addressed This Visit      Other   Anxiety disorder - Primary   Relevant Medications   busPIRone (BUSPAR) 5 MG tablet      1) Anxiety and depression - Good improvement tin depression symptoms - Continued anxiety add buspar 5mg  po bid  2) Thyroid and B12 screen has not been obtained previously  3) Telephone time 12:02 minutes  4) Return in about 4 weeks (around 05/01/2019) for Medication follow up.     Vickie Mood, MD, [facility], [place]   .

## 2019-04-04 ENCOUNTER — Encounter: Payer: Self-pay | Admitting: Family Medicine

## 2019-04-04 ENCOUNTER — Other Ambulatory Visit: Payer: Self-pay | Admitting: Family Medicine

## 2019-04-04 MED ORDER — BUTALBITAL-APAP-CAFFEINE 50-325-40 MG PO TABS: ORAL_TABLET | ORAL | 1 refills | Status: DC

## 2019-04-04 NOTE — Telephone Encounter (Signed)
Requesting a refill (lost bottle) and wants refills put on it??

## 2019-04-04 NOTE — Telephone Encounter (Signed)
   I have refilled the fioricet, with 1 refill     This is a prn medication, try not to use more than a couple times a week   Also insurance may not cover since she just picked it up

## 2019-04-09 ENCOUNTER — Other Ambulatory Visit: Payer: Self-pay | Admitting: Obstetrics and Gynecology

## 2019-04-09 MED ORDER — FLUCONAZOLE 150 MG PO TABS: 150.0000 mg | ORAL_TABLET | Freq: Once | ORAL | 0 refills | Status: DC

## 2019-05-01 ENCOUNTER — Other Ambulatory Visit: Payer: Self-pay

## 2019-05-01 ENCOUNTER — Ambulatory Visit (INDEPENDENT_AMBULATORY_CARE_PROVIDER_SITE_OTHER): Admitting: Obstetrics and Gynecology

## 2019-05-01 ENCOUNTER — Other Ambulatory Visit: Payer: Self-pay | Admitting: Obstetrics and Gynecology

## 2019-05-01 ENCOUNTER — Telehealth: Payer: Self-pay | Admitting: Obstetrics and Gynecology

## 2019-05-01 ENCOUNTER — Encounter: Payer: Self-pay | Admitting: Family Medicine

## 2019-05-01 DIAGNOSIS — F411 Generalized anxiety disorder: Secondary | ICD-10-CM

## 2019-05-01 MED ORDER — BUSPIRONE HCL 10 MG PO TABS: 5.0000 mg | ORAL_TABLET | Freq: Two times a day (BID) | ORAL | 2 refills | Status: DC

## 2019-05-01 MED ORDER — BUTALBITAL-APAP-CAFFEINE 50-325-40 MG PO TABS: ORAL_TABLET | ORAL | 1 refills | Status: DC

## 2019-05-01 MED ORDER — ALPRAZOLAM 0.5 MG PO TABS: 0.5000 mg | ORAL_TABLET | Freq: Two times a day (BID) | ORAL | 0 refills | Status: DC | PRN

## 2019-05-01 NOTE — Telephone Encounter (Signed)
Patient is requesting refill on butalbital-acetaminophen-caffeine (FIORICET) 50-325-40 MG tablet. Please advise

## 2019-05-01 NOTE — Telephone Encounter (Signed)
Ok to refill 

## 2019-05-01 NOTE — Telephone Encounter (Signed)
Please advise 

## 2019-05-01 NOTE — Progress Notes (Signed)
I connected with Vickie Manning on 05/01/19 at 10:50 AM EST by telephone and verified that I am speaking with the correct person using two identifiers.   I discussed the limitations, risks, security and privacy concerns of performing an evaluation and management service by telephone and the availability of in person appointments. I also discussed with the patient that there may be a patient responsible charge related to this service. The patient expressed understanding and agreed to proceed.  The patient was at home I spoke with the patient from my workstation phone The names of people involved in this encounter were: Vickie Manning , and [name]   Obstetrics & Gynecology Office Visit   Chief Complaint:  Chief Complaint  Patient presents with  . Follow-up    Anxiety/Depression    History of Present Illness: The patient is a 28 y.o. female presenting follow up for symptoms of anxiety.  The patient is currently taking Lexapro 20mg  po daily and buspar 5mg  po bid for the management of her symptoms.  She has not had any recent situational stressors.  She reports symptoms of insomnia, irritability, social anxiety and agorophobia.  She denies anhedonia, risk taking behavior, feelings of guilt, feelings of worthlessness, suicidal ideation, homicidal ideation, auditory hallucinations and visual hallucinations. Symptoms have improved since last visit.     Does report intermittent panic attacks.  Overall during the day symptoms seem improved with addition of Lexapro.  Is going to go to counseling as she feels loss of her mother is playing into some of her feelings.    Review of Systems: Review of Systems  Constitutional: Negative.   Gastrointestinal: Negative for nausea.  Neurological: Negative for headaches.  Psychiatric/Behavioral: Negative for depression, hallucinations, substance abuse and suicidal ideas. The patient is nervous/anxious and has insomnia.      Past Medical History:   Past Medical History:  Diagnosis Date  . Anxiety   . Depression     Past Surgical History:  Past Surgical History:  Procedure Laterality Date  . APPENDECTOMY    . DILATION AND CURETTAGE OF UTERUS N/A 11/03/2018   Procedure: DILATATION AND CURETTAGE;  Surgeon: [name], MD;  Location: [facility] ORS;  Service: Gynecology;  Laterality: N/A;  . WISDOM TOOTH EXTRACTION      Gynecologic History: No LMP recorded.  Obstetric History: G1P1001  Family History:  Family History  Problem Relation Age of Onset  . Endometriosis Sister     Social History:  Social History   Socioeconomic History  . Marital status: Married    Spouse name: RJ  . Number of children: Not on file  . Years of education: Not on file  . Highest education level: Some college, no degree  Occupational History  . Not on file  Social Needs  . Financial resource strain: Not hard at all  . Food insecurity    Worry: Never true    Inability: Never true  . Transportation needs    Medical: No    Non-medical: No  Tobacco Use  . Smoking status: Never Smoker  . Smokeless tobacco: Never Used  Substance and Sexual Activity  . Alcohol use: Never    Frequency: Never  . Drug use: Never  . Sexual activity: Not Currently    Comment: undecided  Lifestyle  . Physical activity    Days per week: 0 days    Minutes per session: 0 min  . Stress: To some extent  Relationships  . Social Herbalist on  phone: More than three times a week    Gets together: Once a week    Attends religious service: More than 4 times per year    Active member of club or organization: No    Attends meetings of clubs or organizations: Never    Relationship status: Married  . Intimate partner violence    Fear of current or ex partner: No    Emotionally abused: No    Physically abused: No    Forced sexual activity: No  Other Topics Concern  . Not on file  Social History Narrative  . Not on file    Allergies:  No Known  Allergies  Medications: Prior to Admission medications   Medication Sig Start Date End Date Taking? Authorizing Provider  busPIRone (BUSPAR) 5 MG tablet Take 1 tablet (5 mg total) by mouth 2 (two) times daily. 04/03/19  Yes [name], MD  escitalopram (LEXAPRO) 20 MG tablet Take 1 tablet (20 mg total) by mouth daily. 03/20/19  Yes [name], MD  norethindrone (MICRONOR) 0.35 MG tablet Take 1 tablet (0.35 mg total) by mouth daily. 11/22/18  Yes [name], MD  butalbital-acetaminophen-caffeine (FIORICET) 50-325-40 MG tablet TAKE 1 TO 2 TABLETS BY MOUTH EVERY 6 HOURS AS NEEDED FOR HEADACHE Patient not taking: Reported on 05/01/2019 04/04/19   Alycia Rossetti, MD  ciclopirox [facility]) 8 % solution Apply topically at bedtime. Apply over nail and surrounding skin. Apply daily over previous coat. After seven (7) days, may remove with alcohol and continue cycle. Patient not taking: Reported on 05/01/2019 03/16/19   Alycia Rossetti, MD  hydrOXYzine (ATARAX/VISTARIL) 25 MG tablet Take 1 tablet (25 mg total) by mouth every 6 (six) hours as needed for anxiety. Patient not taking: Reported on 05/01/2019 03/20/19   [name], MD  Magnesium 100 MG CAPS Take by mouth.    [provider]  mupirocin cream (BACTROBAN) 2 % Apply 1 application topically 2 (two) times daily. For 1 week to toe Patient not taking: Reported on 05/01/2019 03/16/19   Alycia Rossetti, MD  Prenat w/o A Vit-FeFum-FePo-FA (CONCEPT OB) 130-92.4-1 MG CAPS Take 1 capsule by mouth daily. Patient not taking: Reported on 05/01/2019 02/10/18   [name], MD    Physical Exam Vitals: There were no vitals filed for this visit. No LMP recorded.  No physical exam as this was a remote telephone visit to promote social distancing during the current COVID-19 Pandemic   GAD 7 : Generalized Anxiety Score 05/01/2019 04/03/2019  Nervous, Anxious, on Edge 2 2  Control/stop worrying 1 2  Worry too much -  different things 1 2  Trouble relaxing 3 1  Restless 3 1  Easily annoyed or irritable 1 0  Afraid - awful might happen 0 1  Total GAD 7 Score 11 9  Anxiety Difficulty Not difficult at all Somewhat difficult    Depression screen [facility] 2/9 05/01/2019 04/03/2019 02/01/2018  Decreased Interest 0 0 0  Down, Depressed, Hopeless 0 0 0  PHQ - 2 Score 0 0 0  Altered sleeping 2 - -  Tired, decreased energy 0 - -  Change in appetite 0 - -  Feeling bad or failure about yourself  0 - -  Trouble concentrating 1 - -  Moving slowly or fidgety/restless 1 - -  Suicidal thoughts 0 - -  PHQ-9 Score 4 - -  Difficult doing work/chores Not difficult at all - -    Depression screen [facility] 2/9 05/01/2019 04/03/2019 02/01/2018  Decreased Interest 0 0 0  Down, Depressed, Hopeless 0 0 0  PHQ - 2 Score 0 0 0  Altered sleeping 2 - -  Tired, decreased energy 0 - -  Change in appetite 0 - -  Feeling bad or failure about yourself  0 - -  Trouble concentrating 1 - -  Moving slowly or fidgety/restless 1 - -  Suicidal thoughts 0 - -  PHQ-9 Score 4 - -  Difficult doing work/chores Not difficult at all - -     Assessment: 28 y.o. G1P1001 follow up anxiety  Plan: Problem List Items Addressed This Visit      Other   Anxiety disorder - Primary   Relevant Medications   ALPRAZolam (XANAX) 0.5 MG tablet   busPIRone (BUSPAR) 10 MG tablet      1) ANxiety - some improvement in symptoms but is having unprovoked panic attacks.   - Continue Lexapro 20mg  - Increase buspar to 10mg  po bid - Add Xanax 0.5mg  po bid prn - agree with counseling  2) Thyroid and B12 screen has not been obtained previously  3) Telephone time 5 min  4) Return in about 4 weeks (around 05/29/2019) for medication follow up.    [name], MD, [facility], [place] [facility] 05/01/2019, 11:47 AM

## 2019-05-24 ENCOUNTER — Encounter: Payer: Self-pay | Admitting: Family Medicine

## 2019-05-24 ENCOUNTER — Other Ambulatory Visit: Payer: Self-pay | Admitting: Family Medicine

## 2019-05-24 NOTE — Telephone Encounter (Signed)
Ok to refill 

## 2019-05-28 ENCOUNTER — Other Ambulatory Visit: Payer: Self-pay | Admitting: Obstetrics and Gynecology

## 2019-05-29 ENCOUNTER — Other Ambulatory Visit: Payer: Self-pay | Admitting: Obstetrics and Gynecology

## 2019-05-31 ENCOUNTER — Other Ambulatory Visit: Payer: Self-pay

## 2019-05-31 ENCOUNTER — Ambulatory Visit (INDEPENDENT_AMBULATORY_CARE_PROVIDER_SITE_OTHER): Admitting: Obstetrics and Gynecology

## 2019-05-31 DIAGNOSIS — Z308 Encounter for other contraceptive management: Secondary | ICD-10-CM | POA: Diagnosis not present

## 2019-05-31 NOTE — Progress Notes (Signed)
I connected with Vickie Manning on 05/31/19 at 11:10 AM EST by telephone and verified that I am speaking with the correct person using two identifiers.   I discussed the limitations, risks, security and privacy concerns of performing an evaluation and management service by telephone and the availability of in person appointments. I also discussed with the patient that there may be a patient responsible charge related to this service. The patient expressed understanding and agreed to proceed.  The patient was at home I spoke with the patient from my workstation phone The names of people involved in this encounter were: Vickie Manning , and Vickie Manning   Chief Complaint: No chief complaint on file.   History of Present Illness: Patient is a 29 y.o. G1P1001 presenting for contraception consult.  She is currently on condoms and desiring to start tubal ligation.  She has a past medical history significant for no contraindication to estrogen.  She specifically denies a history of migraine with aura, chronic hypertension, history of DVT/PE and smoking.  Reported No LMP recorded..    Review of Systems: Review of Systems  Constitutional: Negative.   Gastrointestinal: Negative.   Genitourinary: Negative.      Past Medical History:  Past Medical History:  Diagnosis Date  . Anxiety   . Depression     Past Surgical History:  Past Surgical History:  Procedure Laterality Date  . APPENDECTOMY    . DILATION AND CURETTAGE OF UTERUS N/A 11/03/2018   Procedure: DILATATION AND CURETTAGE;  Surgeon: [name], MD;  Location: [facility] ORS;  Service: Gynecology;  Laterality: N/A;  . WISDOM TOOTH EXTRACTION      Gynecologic History: No LMP recorded.  Obstetric History: G1P1001  Family History:  Family History  Problem Relation Age of Onset  . Endometriosis Sister     Social History:  Social History   Socioeconomic History  . Marital status:  Married    Spouse name: RJ  . Number of children: Not on file  . Years of education: Not on file  . Highest education level: Some college, no degree  Occupational History  . Not on file  Tobacco Use  . Smoking status: Never Smoker  . Smokeless tobacco: Never Used  Substance and Sexual Activity  . Alcohol use: Never  . Drug use: Never  . Sexual activity: Not Currently    Comment: undecided  Other Topics Concern  . Not on file  Social History Narrative  . Not on file   Social Determinants of Health   Financial Resource Strain:   . Difficulty of Paying Living Expenses: Not on file  Food Insecurity:   . Worried About Charity fundraiser in the Last Year: Not on file  . Ran Out of Food in the Last Year: Not on file  Transportation Needs:   . Lack of Transportation (Medical): Not on file  . Lack of Transportation (Non-Medical): Not on file  Physical Activity:   . Days of Exercise per Week: Not on file  . Minutes of Exercise per Session: Not on file  Stress:   . Feeling of Stress : Not on file  Social Connections:   . Frequency of Communication with Friends and Family: Not on file  . Frequency of Social Gatherings with Friends and Family: Not on file  . Attends Religious Services: Not on file  . Active Member of Clubs or Organizations: Not on file  . Attends Archivist Meetings:  Not on file  . Marital Status: Not on file  Intimate Partner Violence:   . Fear of Current or Ex-Partner: Not on file  . Emotionally Abused: Not on file  . Physically Abused: Not on file  . Sexually Abused: Not on file    Allergies:  No Known Allergies  Medications: Prior to Admission medications   Medication Sig Start Date End Date Taking? Authorizing Provider  ALPRAZolam Duanne Moron) 0.5 MG tablet Take 1 tablet (0.5 mg total) by mouth 2 (two) times daily as needed for anxiety. 05/01/19   [name], MD  busPIRone (BUSPAR) 10 MG tablet Take 0.5 tablets (5 mg total) by mouth 2 (two)  times daily. 05/01/19   [name], MD  butalbital-acetaminophen-caffeine (FIORICET) 50-325-40 MG tablet TAKE 1 TO 2 TABLETS BY MOUTH EVERY 6 HOURS AS NEEDED FOR HEADACHE 05/28/19   [name], MD  ciclopirox [facility]) 8 % solution Apply topically at bedtime. Apply over nail and surrounding skin. Apply daily over previous coat. After seven (7) days, may remove with alcohol and continue cycle. Patient not taking: Reported on 05/01/2019 03/16/19   Alycia Rossetti, MD  escitalopram (LEXAPRO) 20 MG tablet Take 1 tablet (20 mg total) by mouth daily. 03/20/19   [name], MD  fluconazole (DIFLUCAN) 150 MG tablet TAKE 1 TABLET BY MOUTH FOR ONE DOSE. MAY REPEAT DOSE IN THREE DAYS IF SYMPTOMS PERSIST. 05/30/19   [name], MD  hydrOXYzine (ATARAX/VISTARIL) 25 MG tablet Take 1 tablet (25 mg total) by mouth every 6 (six) hours as needed for anxiety. Patient not taking: Reported on 05/01/2019 03/20/19   [name], MD  Magnesium 100 MG CAPS Take by mouth.    [provider]  mupirocin cream (BACTROBAN) 2 % Apply 1 application topically 2 (two) times daily. For 1 week to toe Patient not taking: Reported on 05/01/2019 03/16/19   Alycia Rossetti, MD  norethindrone (MICRONOR) 0.35 MG tablet Take 1 tablet (0.35 mg total) by mouth daily. 11/22/18   [name], MD  Prenat w/o A Vit-FeFum-FePo-FA (CONCEPT OB) 130-92.4-1 MG CAPS Take 1 capsule by mouth daily. Patient not taking: Reported on 05/01/2019 02/10/18   [name], MD    Physical Exam Vitals: There were no vitals filed for this Manning. No LMP recorded.  No physical exam as this was a remote telephone Manning to promote social distancing during the current COVID-19 Pandemic  Assessment: 29 y.o. G1P1001 discussion on tubal ligation  Plan: Problem List Items Addressed This Manning    None    Manning Diagnoses    Encounter for tubal ligation counseling    -  Primary     1) 29 y.o. G1P1001  with undesired  fertility, desires permanent sterilization.  Other reversible forms of contraception were discussed with patient; she declines all other modalities. Permanent nature of as well as associated risks of the procedure discussed with patient including but not limited to: risk of regret, permanence of method, bleeding, infection, injury to surrounding organs and need for additional procedures.  Failure risk of 0.5-1% with increased risk of ectopic gestation if pregnancy occurs was also discussed with patient.    2) Phone time 11:82min  3) Return if symptoms worsen or fail to improve.   [name], MD, [facility], [place] [facility] 05/31/2019, 9:15 AM

## 2019-06-01 ENCOUNTER — Ambulatory Visit: Attending: Internal Medicine

## 2019-06-01 DIAGNOSIS — Z20822 Contact with and (suspected) exposure to covid-19: Secondary | ICD-10-CM

## 2019-06-02 LAB — NOVEL CORONAVIRUS, NAA: SARS-CoV-2, NAA: NOT DETECTED

## 2019-06-03 ENCOUNTER — Other Ambulatory Visit: Payer: Self-pay | Admitting: Obstetrics and Gynecology

## 2019-06-03 ENCOUNTER — Encounter: Payer: Self-pay | Admitting: Family Medicine

## 2019-06-04 ENCOUNTER — Other Ambulatory Visit: Payer: Self-pay

## 2019-06-04 ENCOUNTER — Encounter: Payer: Self-pay | Admitting: Family Medicine

## 2019-06-04 ENCOUNTER — Ambulatory Visit (INDEPENDENT_AMBULATORY_CARE_PROVIDER_SITE_OTHER): Admitting: Family Medicine

## 2019-06-04 VITALS — BP 110/72 | HR 80 | Temp 98.1°F | Resp 14 | Ht 66.0 in | Wt 120.0 lb

## 2019-06-04 DIAGNOSIS — Z8669 Personal history of other diseases of the nervous system and sense organs: Secondary | ICD-10-CM | POA: Diagnosis not present

## 2019-06-04 MED ORDER — SUMATRIPTAN SUCCINATE 50 MG PO TABS: 50.0000 mg | ORAL_TABLET | ORAL | 0 refills | Status: DC | PRN

## 2019-06-04 MED ORDER — PROPRANOLOL HCL ER 60 MG PO CP24: 60.0000 mg | ORAL_CAPSULE | Freq: Every day | ORAL | 2 refills | Status: DC

## 2019-06-04 NOTE — Telephone Encounter (Signed)
Advise

## 2019-06-04 NOTE — Progress Notes (Signed)
Subjective:    Patient ID: Vickie Manning, female    DOB: 11-28-1990, 28 y.o.   MRN: [id number]  HPI  03/18/19 Patient is a very pleasant 29 year old Caucasian female here today to discuss treatment options for her migraines.  She has had migraines the majority of her life since adolescence.  She states that she gets them 3-5 times per week!.  She has nausea, photophobia, phonophobia.  She often awakens with a headache.  It is a pounding pulsatile headache.  She has been taking Fioricet when she gets the headache and this will often abort the headache.  However she is using quite a bit of Fioricet every month to help manage the headaches.  Her biggest concern is that she is breast-feeding and she is worried about the amount of aspirin and butalbital that could be getting into her breast milk.  Her son is 73 months old.  Plans are to continue breast-feeding until he is eating solid food.  At that time, my plan was: Spent 25 minutes today with the patient discussing her options.  I believe we need to focus on abortive medication because of the frequency that she is experiencing migraines.  However as I explained to the patient every option for abortive medication that I can think of does transfer into breastmilk.  Propranolol can potentially cause low heart rate in the baby, Topamax can cause birth defects if she were to get pregnant and could also cause sedation for the baby, Aimovig, as I explained to the patient, has not been extensively studied in lactation although no adverse effects are reported.  Abortive medications also will show up in breastmilk such as Imitrex, Fioricet.  Patient has tried ibuprofen with very little success.  Therefore as I explained to the patient, I believe the benefit outweighs the risk given the frequency that she is having headaches.  I would suggest trying Aimovig 70 mg monthly as a preventative medication.  She could then use/continue to use her Fioricet as needed for the  headaches as an abortive but hopefully with less frequency and severity.  Patient will check on this.  06/04/19 I asked the patient come to today's office visit because I was concerned that she was receiving Fioricet from another provider while simultaneously receiving prescriptions from [place] as well.  Patient reports that she gets migraines that last up to 4 days at a time.  She tries to use the medication as sparingly as possible however by the time the migraine passes, she will also be almost out of the medication.  Subsequently she will develop another headache later in the month and will require her to use more Fioricet.  She is also supplementing with Excedrin Migraine.  All of her treatment strategies include large quantities of caffeine.  She was unable to take Topamax due to appetite suppression and it was ineffective.  Her insurance would not cover Aimovig.  Therefore her preventative options are limited.  She has never tried Depakote or propranolol or Botox injections.  She is here today to discuss other options. Past Medical History:  Diagnosis Date  . Anxiety   . Depression    Past Surgical History:  Procedure Laterality Date  . APPENDECTOMY    . DILATION AND CURETTAGE OF UTERUS N/A 11/03/2018   Procedure: DILATATION AND CURETTAGE;  Surgeon: [name], MD;  Location: [facility] ORS;  Service: Gynecology;  Laterality: N/A;  . WISDOM TOOTH EXTRACTION     Current Outpatient Medications on File Prior to Visit  Medication Sig Dispense Refill  . ALPRAZolam (XANAX) 0.5 MG tablet Take 1 tablet (0.5 mg total) by mouth 2 (two) times daily as needed for anxiety. 40 tablet 0  . butalbital-acetaminophen-caffeine (FIORICET) 50-325-40 MG tablet TAKE 1 TO 2 TABLETS BY MOUTH EVERY 6 HOURS AS NEEDED FOR HEADACHE 20 tablet 0  . escitalopram (LEXAPRO) 20 MG tablet Take 1 tablet (20 mg total) by mouth daily. 30 tablet 11  . Magnesium 100 MG CAPS Take by mouth.    . norethindrone (MICRONOR) 0.35 MG tablet  Take 1 tablet (0.35 mg total) by mouth daily. 1 Package 11  . Prenat w/o A Vit-FeFum-FePo-FA (CONCEPT OB) 130-92.4-1 MG CAPS Take 1 capsule by mouth daily. 30 capsule 11   No current facility-administered medications on file prior to visit.   No Known Allergies Social History   Socioeconomic History  . Marital status: Married    Spouse name: RJ  . Number of children: Not on file  . Years of education: Not on file  . Highest education level: Some college, no degree  Occupational History  . Not on file  Tobacco Use  . Smoking status: Never Smoker  . Smokeless tobacco: Never Used  Substance and Sexual Activity  . Alcohol use: Never  . Drug use: Never  . Sexual activity: Not Currently    Comment: undecided  Other Topics Concern  . Not on file  Social History Narrative  . Not on file   Social Determinants of Health   Financial Resource Strain:   . Difficulty of Paying Living Expenses: Not on file  Food Insecurity:   . Worried About Charity fundraiser in the Last Year: Not on file  . Ran Out of Food in the Last Year: Not on file  Transportation Needs:   . Lack of Transportation (Medical): Not on file  . Lack of Transportation (Non-Medical): Not on file  Physical Activity:   . Days of Exercise per Week: Not on file  . Minutes of Exercise per Session: Not on file  Stress:   . Feeling of Stress : Not on file  Social Connections:   . Frequency of Communication with Friends and Family: Not on file  . Frequency of Social Gatherings with Friends and Family: Not on file  . Attends Religious Services: Not on file  . Active Member of Clubs or Organizations: Not on file  . Attends Archivist Meetings: Not on file  . Marital Status: Not on file  Intimate Partner Violence:   . Fear of Current or Ex-Partner: Not on file  . Emotionally Abused: Not on file  . Physically Abused: Not on file  . Sexually Abused: Not on file     Review of Systems  All other systems  reviewed and are negative.      Objective:   Physical Exam Constitutional:      Appearance: Normal appearance. She is normal weight.  Cardiovascular:     Rate and Rhythm: Normal rate and regular rhythm.     Pulses: Normal pulses.     Heart sounds: Normal heart sounds.  Pulmonary:     Effort: Pulmonary effort is normal. No respiratory distress.     Breath sounds: Normal breath sounds. No stridor. No wheezing, rhonchi or rales.  Neurological:     General: No focal deficit present.     Mental Status: She is alert and oriented to person, place, and time. Mental status is at baseline.     Cranial Nerves: No  cranial nerve deficit.     Sensory: No sensory deficit.     Motor: No weakness.     Coordination: Coordination normal.     Gait: Gait normal.     Deep Tendon Reflexes: Reflexes normal.           Assessment & Plan:  History of migraine headaches  I believe that medication overuse is contributing to her headaches.  I believe she could be experiencing medication withdrawal headaches caffeine withdrawal headaches.  I explained the risk of receiving medication from multiple providers.  I have recommended that she discontinue Fioricet and try to gradually wean away from the caffeine that is present and Excedrin Migraine.  Instead I recommend we start a preventative such as propranolol long-acting 60 mg a day and use Imitrex 50 mg for breakthrough migraines.  She could also use ibuprofen for breakthrough migraines.  I will consult neurology as I believe the patient may also benefit from Botox injections.

## 2019-06-05 ENCOUNTER — Encounter: Payer: Self-pay | Admitting: Family Medicine

## 2019-06-07 ENCOUNTER — Telehealth: Payer: Self-pay | Admitting: Obstetrics and Gynecology

## 2019-06-07 NOTE — Telephone Encounter (Signed)
Patient is aware of H&P on 1/26 @ 8:10am w/ Dr. Georgianne Fick, [place] phone interview to be scheduled, COVID testing on 1/29, and OR on 06/26/19. Patient is aware to quarantine after COVID testing. Patient is aware she may receive calls from the [facility]. Patient confirmed [facility], and no secondary insurance.

## 2019-06-07 NOTE — Telephone Encounter (Signed)
-----   Message from [name], MD sent at 05/31/2019  9:10 AM EST ----- Regarding: Surgery Surgery Booking Request Patient Full Name:  Vickie Manning  MRN: [id number]  DOB: [birth year]  Surgeon: [name], MD  Requested Surgery Date and Time: 1-4 weeks Primary Diagnosis AND Code: Desires surgical sterilization Secondary Diagnosis and Code:  Surgical Procedure: Laparoscopic bilateral tubal ligation L&D Notification: No Admission Status: same day surgery Length of Surgery: 1hr Special Case Needs: No H&P: Yes Phone Interview???:  Yes Interpreter: No Language:  Medical Clearance:  No Special Scheduling Instructions: none Any known health/anesthesia issues, diabetes, sleep apnea, latex allergy, defibrillator/pacemaker?: No Acuity: P3   (P1 highest, P2 delay may cause harm, P3 low, elective gyn, P4 lowest)

## 2019-06-08 ENCOUNTER — Telehealth: Payer: Self-pay | Admitting: Obstetrics and Gynecology

## 2019-06-08 NOTE — Telephone Encounter (Signed)
Patient called to say something came up, and she would like to cancel her 06/26/19 surgery. Patient will call back when she is ready to reschedule.

## 2019-06-12 ENCOUNTER — Other Ambulatory Visit: Payer: Self-pay | Admitting: Family Medicine

## 2019-06-12 ENCOUNTER — Encounter: Payer: Self-pay | Admitting: Family Medicine

## 2019-06-12 MED ORDER — TIZANIDINE HCL 4 MG PO TABS: 4.0000 mg | ORAL_TABLET | Freq: Four times a day (QID) | ORAL | 0 refills | Status: DC | PRN

## 2019-06-19 ENCOUNTER — Encounter: Admitting: Obstetrics and Gynecology

## 2019-06-20 ENCOUNTER — Encounter: Payer: Self-pay | Admitting: Family Medicine

## 2019-06-20 DIAGNOSIS — Z8669 Personal history of other diseases of the nervous system and sense organs: Secondary | ICD-10-CM

## 2019-06-26 ENCOUNTER — Ambulatory Visit (INDEPENDENT_AMBULATORY_CARE_PROVIDER_SITE_OTHER): Admitting: Neurology

## 2019-06-26 ENCOUNTER — Encounter: Payer: Self-pay | Admitting: Family Medicine

## 2019-06-26 ENCOUNTER — Ambulatory Visit: Admit: 2019-06-26 | Admitting: Obstetrics and Gynecology

## 2019-06-26 ENCOUNTER — Encounter: Payer: Self-pay | Admitting: Neurology

## 2019-06-26 ENCOUNTER — Other Ambulatory Visit: Payer: Self-pay | Admitting: Obstetrics and Gynecology

## 2019-06-26 ENCOUNTER — Other Ambulatory Visit: Payer: Self-pay

## 2019-06-26 VITALS — BP 104/70 | HR 69 | Temp 97.6°F | Ht 66.0 in | Wt 117.0 lb

## 2019-06-26 DIAGNOSIS — IMO0002 Reserved for concepts with insufficient information to code with codable children: Secondary | ICD-10-CM | POA: Insufficient documentation

## 2019-06-26 DIAGNOSIS — G43709 Chronic migraine without aura, not intractable, without status migrainosus: Secondary | ICD-10-CM

## 2019-06-26 SURGERY — LIGATION, FALLOPIAN TUBE, LAPAROSCOPIC
Anesthesia: Choice | Laterality: Bilateral

## 2019-06-26 MED ORDER — ONDANSETRON 4 MG PO TBDP: 4.0000 mg | ORAL_TABLET | Freq: Three times a day (TID) | ORAL | 6 refills | Status: DC | PRN

## 2019-06-26 MED ORDER — AIMOVIG 70 MG/ML ~~LOC~~ SOAJ: 70.0000 mg | SUBCUTANEOUS | 11 refills | Status: DC

## 2019-06-26 MED ORDER — RIZATRIPTAN BENZOATE 10 MG PO TBDP: 10.0000 mg | ORAL_TABLET | ORAL | 6 refills | Status: DC | PRN

## 2019-06-26 NOTE — Progress Notes (Signed)
PATIENT: Vickie Manning DOB: 1990/11/16  Chief Complaint  Patient presents with  . Migraine    Reports having a headache every other day that turns into a migraine 2-3 times each week. Excedrin Migraine is partially helpful. She was prescribed propranolol but never started it because she was concerned about the potential side effects. Sumatriptan caused nausea and diarrhea. Fioricet was helpful but she was told it was not a good long term med by her PCP. She could not tolerate tizanidine either. She could not afford Aimovig.  Marland Kitchen PCP    Susy Frizzle, MD     HISTORICAL  Vickie Manning is a 29 year old female, seen in request by her primary care physician Dr. Jenna Luo for evaluation of migraine headaches, initial evaluation was on June 26, 2019  I have reviewed and summarized the referring note from the referring physician.  She had long history of migraine since high school, her typical migraine are lateralized severe pounding headache with associated light, noise sensitivity, movement made it worse, lying down in dark quiet room was helpful  She was taking over-the-counter medication ibuprofen, Excedrin Migraine, which only provide partial help of her migraine, following her pregnancy in 2020, she complains of increased migraine headaches, now she is having migraine 3-4 times each week, has to take frequent over-the-counter ibuprofen, Excedrin Migraine, prescription Fioricet without totally taking away her headache, she tried Imitrex 50 mg once, had severe nausea following that  For preventive medication she has tried propanolol, worry about slow heart rate, fatigue, topiramate, loss of appetite, nausea, tizanidine, feeling off, sleepy, dizzy  She also has a long history of anxiety, is taking Lexapro 20 mg daily for 9 months, her son is 9 months, she is still breast-feeding some.   REVIEW OF SYSTEMS: Full 14 system review of systems performed and notable only for as above All  other review of systems were negative.  ALLERGIES: No Known Allergies  HOME MEDICATIONS: Current Outpatient Medications  Medication Sig Dispense Refill  . ALPRAZolam (XANAX) 0.5 MG tablet TAKE 1 TABLET(0.5 MG) BY MOUTH TWICE DAILY AS NEEDED FOR ANXIETY 40 tablet 0  . butalbital-acetaminophen-caffeine (FIORICET) 50-325-40 MG tablet TAKE 1 TO 2 TABLETS BY MOUTH EVERY 6 HOURS AS NEEDED FOR HEADACHE 20 tablet 0  . escitalopram (LEXAPRO) 20 MG tablet Take 1 tablet (20 mg total) by mouth daily. 30 tablet 11  . Magnesium 100 MG CAPS Take by mouth.    . norethindrone (MICRONOR) 0.35 MG tablet Take 1 tablet (0.35 mg total) by mouth daily. 1 Package 11  . Prenat w/o A Vit-FeFum-FePo-FA (CONCEPT OB) 130-92.4-1 MG CAPS Take 1 capsule by mouth daily. 30 capsule 11   No current facility-administered medications for this visit.    PAST MEDICAL HISTORY: Past Medical History:  Diagnosis Date  . Anxiety   . Depression   . Migraines     PAST SURGICAL HISTORY: Past Surgical History:  Procedure Laterality Date  . APPENDECTOMY    . DILATION AND CURETTAGE OF UTERUS N/A 11/03/2018   Procedure: DILATATION AND CURETTAGE;  Surgeon: [name], MD;  Location: [facility] ORS;  Service: Gynecology;  Laterality: N/A;  . WISDOM TOOTH EXTRACTION      FAMILY HISTORY: Family History  Problem Relation Age of Onset  . Healthy Mother   . Healthy Father   . Endometriosis Sister     SOCIAL HISTORY: Social History   Socioeconomic History  . Marital status: Married    Spouse name: RJ  . Number of  children: 1  . Years of education: Not on file  . Highest education level: Some college, no degree  Occupational History  . Occupation: Scientist, water quality  Tobacco Use  . Smoking status: Never Smoker  . Smokeless tobacco: Never Used  Substance and Sexual Activity  . Alcohol use: Never  . Drug use: Never  . Sexual activity: Not Currently    Comment: undecided  Other Topics Concern  . Not on file    Social History Narrative   Lives at home with family.   Right-handed.   1 cup caffeine per day.   Social Determinants of Health   Financial Resource Strain:   . Difficulty of Paying Living Expenses: Not on file  Food Insecurity:   . Worried About Charity fundraiser in the Last Year: Not on file  . Ran Out of Food in the Last Year: Not on file  Transportation Needs:   . Lack of Transportation (Medical): Not on file  . Lack of Transportation (Non-Medical): Not on file  Physical Activity:   . Days of Exercise per Week: Not on file  . Minutes of Exercise per Session: Not on file  Stress:   . Feeling of Stress : Not on file  Social Connections:   . Frequency of Communication with Friends and Family: Not on file  . Frequency of Social Gatherings with Friends and Family: Not on file  . Attends Religious Services: Not on file  . Active Member of Clubs or Organizations: Not on file  . Attends Archivist Meetings: Not on file  . Marital Status: Not on file  Intimate Partner Violence:   . Fear of Current or Ex-Partner: Not on file  . Emotionally Abused: Not on file  . Physically Abused: Not on file  . Sexually Abused: Not on file     PHYSICAL EXAM   Vitals:   06/26/19 1542  BP: 104/70  Pulse: 69  Temp: 97.6 F (36.4 C)  Weight: 117 lb (53.1 kg)  Height: 5\' 6"  (1.676 m)    Not recorded      Body mass index is 18.88 kg/m.  PHYSICAL EXAMNIATION:  Gen: NAD, conversant, well nourised, well groomed                     Cardiovascular: Regular rate rhythm, no peripheral edema, warm, nontender. Eyes: Conjunctivae clear without exudates or hemorrhage Neck: Supple, no carotid bruits. Pulmonary: Clear to auscultation bilaterally   NEUROLOGICAL EXAM:  MENTAL STATUS: Speech:    Speech is normal; fluent and spontaneous with normal comprehension.  Cognition:     Orientation to time, place and person     Normal recent and remote memory     Normal Attention span  and concentration     Normal Language, naming, repeating,spontaneous speech     Fund of knowledge   CRANIAL NERVES: CN II: Visual fields are full to confrontation. Pupils are round equal and briskly reactive to light. CN III, IV, VI: extraocular movement are normal. No ptosis. CN V: Facial sensation is intact to light touch CN VII: Face is symmetric with normal eye closure  CN VIII: Hearing is normal to causal conversation. CN IX, X: Phonation is normal. CN XI: Head turning and shoulder shrug are intact  MOTOR: There is no pronator drift of out-stretched arms. Muscle bulk and tone are normal. Muscle strength is normal.  REFLEXES: Reflexes are 2+ and symmetric at the biceps, triceps, knees, and ankles. Plantar responses are  flexor.  SENSORY: Intact to light touch, pinprick and vibratory sensation are intact in fingers and toes.  COORDINATION: There is no trunk or limb dysmetria noted.  GAIT/STANCE: Posture is normal. Gait is steady with normal steps, base, arm swing, and turning. Heel and toe walking are normal. Tandem gait is normal.  Romberg is absent.   DIAGNOSTIC DATA (LABS, IMAGING, TESTING) - I reviewed patient records, labs, notes, testing and imaging myself where available.   ASSESSMENT AND PLAN  Nari Broeker is a 29 y.o. female   Chronic migraine headaches  She has tried and failed beta-blocker, topiramate as preventive medications, is taking Lexapro  Start Aimovig 70 mg subcutaneous injection once a month  Maxalt as needed, Zofran as needed for nausea, may combine with Aleve for prolonged severe headaches   Marcial Pacas, M.D. Ph.D.  [facility] [address] [place], [place] [address] Ph: [phone] Fax: [phone]  CC: Referring Provider

## 2019-06-26 NOTE — Patient Instructions (Signed)
You may combine  Maxalt  With Zofran as needed for nausea Aleve as needed during prolonged intense headache

## 2019-06-26 NOTE — Telephone Encounter (Signed)
Advise

## 2019-06-27 ENCOUNTER — Telehealth: Payer: Self-pay | Admitting: *Deleted

## 2019-06-27 NOTE — Telephone Encounter (Addendum)
PA for Aimovig completed verbally w/ [facility] [employer] [phone]). Pt [id number]. PA approved 12/24/2019. Case [id number]. I called the patient and left her a voicemail letting her know the medication has been approved and she is now able to pick it up from the pharmacy.

## 2019-06-28 ENCOUNTER — Other Ambulatory Visit: Payer: Self-pay | Admitting: Obstetrics and Gynecology

## 2019-06-28 MED ORDER — ALPRAZOLAM 0.5 MG PO TABS: ORAL_TABLET | ORAL | 0 refills | Status: DC

## 2019-06-29 ENCOUNTER — Telehealth: Payer: Self-pay

## 2019-06-29 NOTE — Telephone Encounter (Signed)
Pt calling wanting to know if [facility] can call in Fioricet for her migraines, her neurologist has sent in a different RX but her insurance is not covering it, so she would like the Fioricet until then

## 2019-07-02 ENCOUNTER — Other Ambulatory Visit: Payer: Self-pay | Admitting: Family Medicine

## 2019-07-02 MED ORDER — BUTALBITAL-APAP-CAFFEINE 50-325-40 MG PO TABS: 1.0000 | ORAL_TABLET | ORAL | 0 refills | Status: DC | PRN

## 2019-07-02 NOTE — Telephone Encounter (Signed)
So it would be up to the neurologist at this point to manage that

## 2019-07-03 NOTE — Telephone Encounter (Signed)
Left message to advise pt  

## 2019-07-11 ENCOUNTER — Other Ambulatory Visit: Payer: Self-pay | Admitting: Obstetrics and Gynecology

## 2019-07-16 ENCOUNTER — Other Ambulatory Visit: Payer: Self-pay | Admitting: Neurology

## 2019-07-16 DIAGNOSIS — G43709 Chronic migraine without aura, not intractable, without status migrainosus: Secondary | ICD-10-CM

## 2019-07-16 DIAGNOSIS — IMO0002 Reserved for concepts with insufficient information to code with codable children: Secondary | ICD-10-CM

## 2019-07-16 MED ORDER — BUTALBITAL-APAP-CAFFEINE 50-325-40 MG PO TABS: 1.0000 | ORAL_TABLET | Freq: Four times a day (QID) | ORAL | 5 refills | Status: DC | PRN

## 2019-07-24 ENCOUNTER — Other Ambulatory Visit: Payer: Self-pay | Admitting: Obstetrics and Gynecology

## 2019-07-24 NOTE — Telephone Encounter (Signed)
Advise

## 2019-08-16 ENCOUNTER — Other Ambulatory Visit: Payer: Self-pay | Admitting: Family Medicine

## 2019-08-16 NOTE — Telephone Encounter (Signed)
Ok to refill??  Last office visit 06/04/2019.  Last refill 07/02/2019.

## 2019-08-20 ENCOUNTER — Encounter: Payer: Self-pay | Admitting: *Deleted
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
## 2019-08-22 ENCOUNTER — Other Ambulatory Visit: Payer: Self-pay | Admitting: Obstetrics and Gynecology

## 2019-08-28 ENCOUNTER — Other Ambulatory Visit: Payer: Self-pay | Admitting: Family Medicine

## 2019-08-28 ENCOUNTER — Encounter: Payer: Self-pay | Admitting: Family Medicine

## 2019-08-31 ENCOUNTER — Other Ambulatory Visit: Payer: Self-pay | Admitting: Obstetrics and Gynecology

## 2019-09-03 NOTE — Telephone Encounter (Signed)
Advise

## 2019-09-06 ENCOUNTER — Other Ambulatory Visit: Payer: Self-pay | Admitting: *Deleted

## 2019-09-06 ENCOUNTER — Encounter: Payer: Self-pay | Admitting: *Deleted

## 2019-09-06 ENCOUNTER — Telehealth: Payer: Self-pay | Admitting: *Deleted

## 2019-09-06 MED ORDER — BUTALBITAL-APAP-CAFFEINE 50-325-40 MG PO TABS: ORAL_TABLET | ORAL | 5 refills | Status: DC

## 2019-09-06 MED ORDER — TIZANIDINE HCL 4 MG PO TABS: ORAL_TABLET | ORAL | 0 refills | Status: DC

## 2019-09-06 MED ORDER — ZOLMITRIPTAN 5 MG PO TABS: ORAL_TABLET | ORAL | 5 refills | Status: DC

## 2019-09-06 NOTE — Telephone Encounter (Addendum)
I was able to speak to the patient. 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 She has Fioricet and Maxalt at home. She no longer uses Fioricet because she feels her headaches become worse. She feels chest tightness with Maxalt. She feels Imitrex causes worsening nausea.  Per vo by Dr. Krista Blue, she will provide new rx for zomitriptan 5mg  and tizanidine 4mg . She may combine these medications in combination with her ondansetron.   I spoke to the patient and she verbalized understanding of these treatment changes.

## 2019-09-06 NOTE — Telephone Encounter (Signed)
Left message requesting a return call.    Routing comment   Nobie Putnam, MD 6 hours ago (7:28 AM)   Hello,  I'm sorry to bother you, I'm just at a loss right now. I've been battling a migraine for 3 days now and I feel like it's getting worse. We're supposed to be leaving to go out of town this afternoon and when I sit up my head throbs, I can feel it right behind my right eye. I got Botox about a week and a half ago hoping at [facility] but I'm not sure how long that takes to kick in. I just feel like I'm running out of options of what I can do to help this, nothing is helping to relieve the pain. Are there any other suggestions of what I can do to help it?

## 2019-09-06 NOTE — Addendum Note (Signed)
Addended by: Noberto Retort C on: 09/06/2019 02:59 PM   Modules accepted: Orders

## 2019-09-10 ENCOUNTER — Ambulatory Visit (INDEPENDENT_AMBULATORY_CARE_PROVIDER_SITE_OTHER)
Admission: RE | Admit: 2019-09-10 | Discharge: 2019-09-10 | Disposition: A | Discharge status: Home / Self Care | Source: Ambulatory Visit

## 2019-09-10 ENCOUNTER — Other Ambulatory Visit: Payer: Self-pay

## 2019-09-10 ENCOUNTER — Other Ambulatory Visit: Payer: Self-pay | Admitting: Obstetrics & Gynecology

## 2019-09-10 DIAGNOSIS — R102 Pelvic and perineal pain: Secondary | ICD-10-CM

## 2019-09-10 MED ORDER — BUTALBITAL-APAP-CAFFEINE 50-325-40 MG PO TABS: ORAL_TABLET | ORAL | 5 refills | Status: AC

## 2019-09-10 MED ORDER — MELOXICAM 7.5 MG PO TABS: 7.5000 mg | ORAL_TABLET | Freq: Two times a day (BID) | ORAL | 3 refills | Status: DC | PRN

## 2019-09-10 MED ORDER — CELECOXIB 200 MG PO CAPS: 200.0000 mg | ORAL_CAPSULE | Freq: Two times a day (BID) | ORAL | 0 refills | Status: DC

## 2019-09-10 NOTE — ED Provider Notes (Signed)
Virtual Visit via Video Note:  Vickie Manning  initiated request for Telemedicine visit with [facility] team. I connected with Vickie Manning  on 09/10/2019 at 10:02 AM  for a synchronized telemedicine visit using a video enabled HIPPA compliant telemedicine application. I verified that I am speaking with Vickie Manning  using two identifiers. Jaynee Eagles, PA-C  was physically located in a [facility] Urgent care site and Vickie Manning was located at a different location.   The limitations of evaluation and management by telemedicine as well as the availability of in-person appointments were discussed. Patient was informed that she  may incur a bill ( including co-pay) for this virtual visit encounter. Vickie Manning  expressed understanding and gave verbal consent to proceed with virtual visit.     History of Present Illness:Vickie Manning  is a 29 y.o. female presents with 3 day hx of acute onset severe menstrual cramps, feels like contractions and are very painful. Has used 400mg -600mg  ibuprofen, Advil very consistently but is not helping. Also tried heating pad over the weekend. Has fecal urgency but has been defecating daily, is not constipated and states that this is somewhat normal for her. Denies fever, dysuria, hematuria, urinary frequency, vomiting, vaginal discharge. She is on OCP. Had a pregnancy more than a year ago, and bled for the 1st three months without known etiology. She gave birth ~1 year ago, had complications that involved D&C. Gave birth without complication to the baby.  Has an appt with her gynecologist on 09/14/2019. Takes Lexapro, Fioricet. Patient does not have a hx of endometriosis, ovarian cysts, fibroids. Has had HPV, last pap smear was ~1 year ago.   ROS  No current facility-administered medications for this encounter.   Current Outpatient Medications  Medication Sig Dispense Refill  . ALPRAZolam (XANAX) 0.5 MG tablet TAKE 1 TABLET(0.5 MG) BY MOUTH TWICE DAILY  AS NEEDED FOR ANXIETY 40 tablet 0  . butalbital-acetaminophen-caffeine (FIORICET) 50-325-40 MG tablet Take one tablet twice daily as need to migraines. No more than 2 tabs daily. No more than 10 tabs monthly. 10 tablet 5  . Erenumab-aooe (AIMOVIG) 70 MG/ML SOAJ Inject 70 mg into the skin every 30 (thirty) days. 1 pen 11  . escitalopram (LEXAPRO) 20 MG tablet Take 1 tablet (20 mg total) by mouth daily. 30 tablet 11  . Magnesium 100 MG CAPS Take by mouth.    . meloxicam (MOBIC) 7.5 MG tablet Take 1 tablet (7.5 mg total) by mouth 2 (two) times daily as needed for pain. 20 tablet 3  . norethindrone (MICRONOR) 0.35 MG tablet Take 1 tablet (0.35 mg total) by mouth daily. 1 Package 11  . ondansetron (ZOFRAN ODT) 4 MG disintegrating tablet Take 1 tablet (4 mg total) by mouth every 8 (eight) hours as needed. 20 tablet 6  . Prenat w/o A Vit-FeFum-FePo-FA (CONCEPT OB) 130-92.4-1 MG CAPS Take 1 capsule by mouth daily. 30 capsule 11  . zolmitriptan (ZOMIG) 5 MG tablet Take 1 tab at onset of migraine.  May repeat in 2 hrs, if needed.  Max dose: 2 tabs/day. This is a 30 day prescription. 12 tablet 5     Allergies  Allergen Reactions  . Rizatriptan Other (See Comments)    Chest tightness   . Sumatriptan Other (See Comments)    Worsening nausea  . Tizanidine Other (See Comments)    "did not like the way it made her feel"     Past Medical History:  Diagnosis Date  . Anxiety   .  Depression   . Migraines     Past Surgical History:  Procedure Laterality Date  . APPENDECTOMY    . DILATION AND CURETTAGE OF UTERUS N/A 11/03/2018   Procedure: DILATATION AND CURETTAGE;  Surgeon: [name], MD;  Location: [facility] ORS;  Service: Gynecology;  Laterality: N/A;  . WISDOM TOOTH EXTRACTION        Observations/Objective: Physical Exam Constitutional:      General: She is not in acute distress.    Appearance: Normal appearance. She is well-developed. She is not ill-appearing, toxic-appearing or  diaphoretic.  Eyes:     Extraocular Movements: Extraocular movements intact.  Pulmonary:     Effort: Pulmonary effort is normal.  Neurological:     General: No focal deficit present.     Mental Status: She is alert and oriented to person, place, and time.  Psychiatric:        Mood and Affect: Mood normal.        Behavior: Behavior normal.        Thought Content: Thought content normal.        Judgment: Judgment normal.      Assessment and Plan:  I have reviewed the PDMP during this encounter.   1. Pelvic cramping    Unclear etiology.  Counseled on possibility of endometriosis, uterine fibroids, ovarian cyst, pregnancy (despite taking OCP).  Would likely benefit from having a pelvic and transvaginal ultrasound, cervical cytology swab, urinalysis and blood work, etc.  Unfortunately I cannot coordinate this through video visit.  Patient does however have an upcoming visit with her gynecologist for work-up.  In the meantime, recommended she use Celebrex 200 mg twice daily.  Strict ER precautions. Counseled patient on potential for adverse effects with medications prescribed today, patient verbalized understanding.    Follow Up Instructions:    I discussed the assessment and treatment plan with the patient. The patient was provided an opportunity to ask questions and all were answered. The patient agreed with the plan and demonstrated an understanding of the instructions.   The patient was advised to call back or seek an in-person evaluation if the symptoms worsen or if the condition fails to improve as anticipated.  I provided 15 minutes of non-face-to-face time during this encounter.    Jaynee Eagles, PA-C  09/10/2019 10:02 AM         Jaynee Eagles, PA-C 09/10/19 1034

## 2019-09-10 NOTE — Addendum Note (Signed)
Addended by: Marcial Pacas on: 09/10/2019 10:06 AM   Modules accepted: Orders

## 2019-09-12 ENCOUNTER — Telehealth: Payer: Self-pay | Admitting: Obstetrics and Gynecology

## 2019-09-12 ENCOUNTER — Encounter: Payer: Self-pay | Admitting: Family Medicine

## 2019-09-12 NOTE — Telephone Encounter (Signed)
Nexplanon insertion appointment

## 2019-09-12 NOTE — Telephone Encounter (Signed)
Patient is schedule for 09/14/19 for nexplanon placement with AMS

## 2019-09-13 ENCOUNTER — Ambulatory Visit (INDEPENDENT_AMBULATORY_CARE_PROVIDER_SITE_OTHER): Admitting: Family Medicine

## 2019-09-13 ENCOUNTER — Encounter: Payer: Self-pay | Admitting: Family Medicine

## 2019-09-13 ENCOUNTER — Other Ambulatory Visit: Payer: Self-pay | Admitting: Family Medicine

## 2019-09-13 ENCOUNTER — Ambulatory Visit
Admission: RE | Admit: 2019-09-13 | Discharge: 2019-09-13 | Disposition: A | Discharge status: Home / Self Care | Source: Ambulatory Visit | Attending: Family Medicine | Admitting: Family Medicine

## 2019-09-13 ENCOUNTER — Other Ambulatory Visit: Payer: Self-pay

## 2019-09-13 VITALS — BP 122/76 | HR 84 | Temp 97.3°F | Resp 16 | Ht 66.0 in | Wt 119.0 lb

## 2019-09-13 DIAGNOSIS — M79675 Pain in left toe(s): Secondary | ICD-10-CM | POA: Diagnosis not present

## 2019-09-13 MED ORDER — TRAMADOL HCL 50 MG PO TABS: 50.0000 mg | ORAL_TABLET | Freq: Three times a day (TID) | ORAL | 0 refills | Status: AC | PRN

## 2019-09-13 NOTE — Progress Notes (Signed)
Subjective:    Patient ID: Vickie Manning, female    DOB: [birth year], 29 y.o.   MRN: [id number]  HPI Yesterday, a metal plant stand fell over and landed directly on the proximal phalanx of her left great toe.  She now has considerable pain and swelling in that toe distal to the MTP joint and prior to the IP joint.  The toe is swollen and tender to palpation.  She also has onychomycosis in the left great toenail and appears to have some proximal separation from the underlying nail matrix. Past Medical History:  Diagnosis Date  . Anxiety   . Depression   . Migraines    Past Surgical History:  Procedure Laterality Date  . APPENDECTOMY    . DILATION AND CURETTAGE OF UTERUS N/A 11/03/2018   Procedure: DILATATION AND CURETTAGE;  Surgeon: [name], MD;  Location: [facility] ORS;  Service: Gynecology;  Laterality: N/A;  . WISDOM TOOTH EXTRACTION     Current Outpatient Medications on File Prior to Visit  Medication Sig Dispense Refill  . ALPRAZolam (XANAX) 0.5 MG tablet TAKE 1 TABLET(0.5 MG) BY MOUTH TWICE DAILY AS NEEDED FOR ANXIETY 40 tablet 0  . butalbital-acetaminophen-caffeine (FIORICET) 50-325-40 MG tablet Take one tablet twice daily as need to migraines. No more than 2 tabs daily. No more than 10 tabs monthly. 10 tablet 5  . celecoxib (CELEBREX) 200 MG capsule Take 1 capsule (200 mg total) by mouth 2 (two) times daily. 30 capsule 0  . escitalopram (LEXAPRO) 20 MG tablet Take 1 tablet (20 mg total) by mouth daily. 30 tablet 11  . meloxicam (MOBIC) 7.5 MG tablet Take 1 tablet (7.5 mg total) by mouth 2 (two) times daily as needed for pain. 20 tablet 3  . norethindrone (MICRONOR) 0.35 MG tablet Take 1 tablet (0.35 mg total) by mouth daily. 1 Package 11  . ondansetron (ZOFRAN ODT) 4 MG disintegrating tablet Take 1 tablet (4 mg total) by mouth every 8 (eight) hours as needed. 20 tablet 6  . zolmitriptan (ZOMIG) 5 MG tablet Take 1 tab at onset of migraine.  May repeat in 2 hrs, if needed.  Max  dose: 2 tabs/day. This is a 30 day prescription. 12 tablet 5   No current facility-administered medications on file prior to visit.   Allergies  Allergen Reactions  . Rizatriptan Other (See Comments)    Chest tightness   . Sumatriptan Other (See Comments)    Worsening nausea  . Tizanidine Other (See Comments)    "did not like the way it made her feel"   Social History   Socioeconomic History  . Marital status: Married    Spouse name: RJ  . Number of children: 1  . Years of education: Not on file  . Highest education level: Some college, no degree  Occupational History  . Occupation: Scientist, water quality  Tobacco Use  . Smoking status: Never Smoker  . Smokeless tobacco: Never Used  Substance and Sexual Activity  . Alcohol use: Never  . Drug use: Never  . Sexual activity: Not Currently    Comment: undecided  Other Topics Concern  . Not on file  Social History Narrative   Lives at home with family.   Right-handed.   1 cup caffeine per day.   Social Determinants of Health   Financial Resource Strain:   . Difficulty of Paying Living Expenses:   Food Insecurity:   . Worried About Charity fundraiser in the Last Year:   . Ran  Out of Food in the Last Year:   Transportation Needs:   . Lack of Transportation (Medical):   Marland Kitchen Lack of Transportation (Non-Medical):   Physical Activity:   . Days of Exercise per Week:   . Minutes of Exercise per Session:   Stress:   . Feeling of Stress :   Social Connections:   . Frequency of Communication with Friends and Family:   . Frequency of Social Gatherings with Friends and Family:   . Attends Religious Services:   . Active Member of Clubs or Organizations:   . Attends Archivist Meetings:   Marland Kitchen Marital Status:   Intimate Partner Violence:   . Fear of Current or Ex-Partner:   . Emotionally Abused:   Marland Kitchen Physically Abused:   . Sexually Abused:      Review of Systems  All other systems reviewed and are negative.        Objective:   Physical Exam Constitutional:      Appearance: Normal appearance. She is normal weight.  Cardiovascular:     Rate and Rhythm: Normal rate and regular rhythm.     Pulses: Normal pulses.     Heart sounds: Normal heart sounds.  Pulmonary:     Effort: Pulmonary effort is normal.     Breath sounds: Normal breath sounds.  Musculoskeletal:     Left foot: Swelling, deformity, tenderness and bony tenderness present.       Feet:  Neurological:     Mental Status: She is alert.           Assessment & Plan:  Great toe pain, left - Plan: DG Foot Complete Left  Proceed with an x-ray of the foot.  I feel certain that her proximal phalanx is fractured.  I will most likely recommend a postop shoe for the next 2 to 4 weeks depending on healing.  Recommended that she stay off of her foot and keep it elevated is much as possible.  She can take at 1000 g of Tylenol 4 times a day and/or ibuprofen 800 mg every 8 hours.

## 2019-09-14 ENCOUNTER — Ambulatory Visit (INDEPENDENT_AMBULATORY_CARE_PROVIDER_SITE_OTHER): Admitting: Obstetrics and Gynecology

## 2019-09-14 ENCOUNTER — Encounter: Payer: Self-pay | Admitting: Obstetrics and Gynecology

## 2019-09-14 VITALS — BP 100/66 | Ht 66.0 in | Wt 119.0 lb

## 2019-09-14 DIAGNOSIS — Z30017 Encounter for initial prescription of implantable subdermal contraceptive: Secondary | ICD-10-CM

## 2019-09-14 NOTE — Progress Notes (Signed)
    GYNECOLOGY PROCEDURE NOTE  Patient is a 29 y.o. G1P1001 presenting for Nexplanon insertion as her desires means of contraception.  She provided informed consent, signed copy in the chart, time out was performed. Transition from OCP.  She understands that Nexplanon is a progesterone only therapy, and that patients often patients have irregular and unpredictable vaginal bleeding or amenorrhea. She understands that other side effects are possible related to systemic progesterone, including but not limited to, headaches, breast tenderness, nausea, and irritability. While effective at preventing pregnancy long acting reversible contraceptives do not prevent transmission of sexually transmitted diseases and use of barrier methods for this purpose was discussed. The placement procedure for Nexplanon was reviewed with the patient in detail including risks of nerve injury, infection, bleeding and injury to other muscles or tendons. She understands that the Nexplanon implant is good for 3 years and needs to be removed at the end of that time.  She understands that Nexplanon is an extremely effective option for contraception, with failure rate of <1%. This information is reviewed today and all questions were answered. Informed consent was obtained, both verbally and written.   The patient is healthy and has no contraindications to Implanon use. Urine pregnancy test was performed today and was negative.  Procedure Appropriate time out taken.  Patient placed in dorsal supine with left arm above head, elbow flexed at 90 degrees, arm resting on examination table.  The bicipital grove was palpated and site 8-10cm proximal to the medial epicondyle was indentified . The insertion site was prepped with a two betadine swabs and then injected with 3 cc of 1% lidocaine without epinephrine.  Nexplanon removed form sterile blister packaging,  Device confirmed in needle, before inserting full length of needle, tenting up the  skin as the needle was advance.  The drug eluting rod was then deployed by pulling back the slider per the manufactures recommendation.  The implant was palpable by the clinician as well as the patient.  The insertion site covered dressed with a band aid before applying  a kerlex bandage pressure dressing..Minimal blood loss was noted during the procedure.  The patientt tolerated the procedure well.   She was instructed to wear the bandage for 24 hours, call with any signs of infection.  She was given the Implanon card and instructed to have the rod removed in 3 years.

## 2019-09-16 ENCOUNTER — Other Ambulatory Visit: Payer: Self-pay | Admitting: Family Medicine

## 2019-09-16 ENCOUNTER — Encounter: Payer: Self-pay | Admitting: Family Medicine

## 2019-09-17 ENCOUNTER — Other Ambulatory Visit: Payer: Self-pay | Admitting: Obstetrics and Gynecology

## 2019-09-17 MED ORDER — ALPRAZOLAM 0.5 MG PO TABS: 0.5000 mg | ORAL_TABLET | Freq: Two times a day (BID) | ORAL | 0 refills | Status: AC | PRN

## 2019-09-24 ENCOUNTER — Telehealth (HOSPITAL_COMMUNITY): Payer: Self-pay | Admitting: Urgent Care

## 2019-09-24 ENCOUNTER — Other Ambulatory Visit: Payer: Self-pay

## 2019-09-24 ENCOUNTER — Ambulatory Visit (HOSPITAL_COMMUNITY)
Admission: EM | Admit: 2019-09-24 | Discharge: 2019-09-24 | Disposition: A | Discharge status: Home / Self Care | Attending: Urgent Care | Admitting: Urgent Care

## 2019-09-24 DIAGNOSIS — K08409 Partial loss of teeth, unspecified cause, unspecified class: Secondary | ICD-10-CM

## 2019-09-24 DIAGNOSIS — K0889 Other specified disorders of teeth and supporting structures: Secondary | ICD-10-CM | POA: Diagnosis not present

## 2019-09-24 MED ORDER — HYDROCODONE-ACETAMINOPHEN 5-325 MG PO TABS: 1.0000 | ORAL_TABLET | Freq: Three times a day (TID) | ORAL | 0 refills | Status: AC | PRN

## 2019-09-24 MED ORDER — NAPROXEN 500 MG PO TABS: 500.0000 mg | ORAL_TABLET | Freq: Two times a day (BID) | ORAL | 0 refills | Status: DC

## 2019-09-24 MED ORDER — HYDROCODONE-ACETAMINOPHEN 5-300 MG PO TABS: 1.0000 | ORAL_TABLET | Freq: Three times a day (TID) | ORAL | 0 refills | Status: DC | PRN

## 2019-09-24 MED ORDER — LIDOCAINE VISCOUS HCL 2 % MT SOLN: 15.0000 mL | Freq: Two times a day (BID) | OROMUCOSAL | 0 refills | Status: DC | PRN

## 2019-09-24 NOTE — Discharge Instructions (Addendum)
Currently I do not see sign of dental infection, cavities, gingivitis, dental problem requiring antibiotics.  I do see signs that he may still have wisdom teeth that are erupting or perhaps impacted posteriorly for the lower jaw on either side.  For this reason we will hold off on antibiotic use and use strong pain medicines to help you with your pain.  I would like for you to use naproxen and viscous lidocaine.  If this does not help with your symptoms then it is okay to use hydrocodone.  We have discussed breast-feeding with the use of these medications and therefore use them very cautiously, okay to pump and dump as well.  Make sure you keep follow-up with your dental surgeon.

## 2019-09-24 NOTE — ED Provider Notes (Signed)
[place]   MRN: [id number] DOB: 24-Apr-1991  Subjective:   Vickie Manning is a 29 y.o. female presenting for 3 day hx of acute onset worsening now severe dental pain and sensitivity lower incisors. Eating and drinking elicits the pain. Has tried APAP, ibuprofen with minimal relief. She is currently weaning off breastfeeding. Has had top wisdom teeth extracted 8 years ago. At the time bottom wisdom teeth had not erupted. Has generally had good tooth care outside of that.   No current facility-administered medications for this encounter.  Current Outpatient Medications:  .  ALPRAZolam (XANAX) 0.5 MG tablet, Take 1 tablet (0.5 mg total) by mouth 2 (two) times daily as needed for anxiety., Disp: 40 tablet, Rfl: 0 .  butalbital-acetaminophen-caffeine (FIORICET) 50-325-40 MG tablet, Take one tablet twice daily as need to migraines. No more than 2 tabs daily. No more than 10 tabs monthly., Disp: 10 tablet, Rfl: 5 .  escitalopram (LEXAPRO) 20 MG tablet, Take 1 tablet (20 mg total) by mouth daily., Disp: 30 tablet, Rfl: 11 .  norethindrone (MICRONOR) 0.35 MG tablet, Take 1 tablet (0.35 mg total) by mouth daily., Disp: 1 Package, Rfl: 11   Allergies  Allergen Reactions  . Rizatriptan Other (See Comments)    Chest tightness   . Sumatriptan Other (See Comments)    Worsening nausea  . Tizanidine Other (See Comments)    "did not like the way it made her feel"    Past Medical History:  Diagnosis Date  . Anxiety   . Depression   . Migraines      Past Surgical History:  Procedure Laterality Date  . APPENDECTOMY    . DILATION AND CURETTAGE OF UTERUS N/A 11/03/2018   Procedure: DILATATION AND CURETTAGE;  Surgeon: [name], MD;  Location: [facility] ORS;  Service: Gynecology;  Laterality: N/A;  . WISDOM TOOTH EXTRACTION      Family History  Problem Relation Age of Onset  . Healthy Mother   . Healthy Father   . Endometriosis Sister     Social History   Tobacco Use  .  Smoking status: Never Smoker  . Smokeless tobacco: Never Used  Substance Use Topics  . Alcohol use: Never  . Drug use: Never    ROS   Objective:   Vitals: BP 108/69   Pulse 81   Temp 98 F (36.7 C)   Resp 16   SpO2 96%   Physical Exam Constitutional:      General: She is not in acute distress.    Appearance: Normal appearance. She is well-developed. She is not ill-appearing, toxic-appearing or diaphoretic.  HENT:     Head: Normocephalic and atraumatic.     Nose: Nose normal.     Mouth/Throat:     Mouth: Mucous membranes are moist.     Dentition: Does not have dentures. Gingival swelling present. No dental tenderness, dental caries, dental abscesses or gum lesions.     Pharynx: Oropharynx is clear.   Eyes:     General: No scleral icterus.       Right eye: No discharge.        Left eye: No discharge.     Extraocular Movements: Extraocular movements intact.     Pupils: Pupils are equal, round, and reactive to light.  Cardiovascular:     Rate and Rhythm: Normal rate.  Pulmonary:     Effort: Pulmonary effort is normal.  Skin:    General: Skin is warm and dry.  Neurological:  General: No focal deficit present.     Mental Status: She is alert and oriented to person, place, and time.  Psychiatric:        Mood and Affect: Mood normal.        Behavior: Behavior normal.        Thought Content: Thought content normal.        Judgment: Judgment normal.     Assessment and Plan :   I have reviewed the PDMP during this encounter.  1. Pain, dental   2. History of third molar tooth extraction, unspecified edentulism class     Patient needs further evaluation with her dental surgeon. Has a consult with them on Thursday. Will help with hydrocodone for her severe pain. Counseled on safety in terms of multiple CS, breastfeeding which she is weaning her baby off of anyway. Counseled patient on potential for adverse effects with medications prescribed/recommended today, ER  and return-to-clinic precautions discussed, patient verbalized understanding.    Jaynee Eagles, [place] 09/25/19 [phone]

## 2019-09-24 NOTE — Telephone Encounter (Signed)
Pharmacy called and requested script change from 5-[address] [place] to [place]. I did this, sent the script electronically.

## 2019-09-24 NOTE — ED Triage Notes (Signed)
C/o pain and sensitivity x 3 days, has dentist appointment on Thursday

## 2019-09-27 ENCOUNTER — Ambulatory Visit: Admitting: Neurology

## 2019-09-28 ENCOUNTER — Telehealth (HOSPITAL_COMMUNITY): Payer: Self-pay | Admitting: Urgent Care

## 2019-09-28 NOTE — Telephone Encounter (Signed)
Patient is requesting more hydrocodone. I am refusing this as she has multiple controlled substances with no significant findings on exam. She already saw her dentist and needs to follow up with her PCP, dentist for more controlled substance.

## 2019-09-28 NOTE — Telephone Encounter (Signed)
Patient called requesting more pain medication until next dental visit.  After speaking with Driscilla Grammes PA, advised patient she would need to reach out to her PCP or dentist for further treatment. Patient voiced understanding.  No other questions asked.

## 2019-10-01 ENCOUNTER — Encounter: Payer: Self-pay | Admitting: Obstetrics and Gynecology

## 2019-10-01 ENCOUNTER — Ambulatory Visit (INDEPENDENT_AMBULATORY_CARE_PROVIDER_SITE_OTHER): Admitting: Obstetrics and Gynecology

## 2019-10-01 ENCOUNTER — Other Ambulatory Visit: Payer: Self-pay

## 2019-10-01 VITALS — BP 100/58 | HR 117 | Wt 118.0 lb

## 2019-10-01 DIAGNOSIS — N879 Dysplasia of cervix uteri, unspecified: Secondary | ICD-10-CM

## 2019-10-01 DIAGNOSIS — N61 Mastitis without abscess: Secondary | ICD-10-CM | POA: Diagnosis not present

## 2019-10-01 MED ORDER — NYSTATIN 100000 UNIT/GM EX CREA: 1.0000 "application " | TOPICAL_CREAM | Freq: Two times a day (BID) | CUTANEOUS | 1 refills | Status: DC

## 2019-10-01 MED ORDER — SULFAMETHOXAZOLE-TRIMETHOPRIM 800-160 MG PO TABS: 1.0000 | ORAL_TABLET | Freq: Two times a day (BID) | ORAL | 1 refills | Status: DC

## 2019-10-01 NOTE — Telephone Encounter (Signed)
Patient is schedule 10/01/19 at 3 pm with AMS

## 2019-10-01 NOTE — Progress Notes (Signed)
Obstetrics & Gynecology Office Visit   Chief Complaint:  Chief Complaint  Patient presents with  . Mastitis    Right breast pain    History of Present Illness: 29 y.o. G1P1001 s/p TSVD on 10/06/2018 with delayed postpartum hemorrhage requiring D&C for retained POC 11/04/2018 presenting with left breast pain in the setting of breastfeeding.  She denies documented fevers or chills at home, but has not taken her temperature either.  There are no associated skin or nipple changes.  The pain is located around the areola.  She is using Nexplanon for contraception placed 09/14/2019.  Review of Systems: 10 point review of systems negative unless otherwise noted in HPI  Past Medical History:  Past Medical History:  Diagnosis Date  . Anxiety   . Depression   . Migraines     Past Surgical History:  Past Surgical History:  Procedure Laterality Date  . APPENDECTOMY    . DILATION AND CURETTAGE OF UTERUS N/A 11/03/2018   Procedure: DILATATION AND CURETTAGE;  Surgeon: [name], MD;  Location: [facility] ORS;  Service: Gynecology;  Laterality: N/A;  . nexplanon    . WISDOM TOOTH EXTRACTION      Vickie History: No LMP recorded.  Obstetric History: G1P1001 12/26/2018 Colposcopy CIN I 11/22/2018 ASCUS HPV positive 02/13/2018 ASCUS HPV positive  Family History:  Family History  Problem Relation Age of Onset  . Healthy Mother   . Healthy Father   . Endometriosis Sister     Social History:  Social History   Socioeconomic History  . Marital status: Married    Spouse name: RJ  . Number of children: 1  . Years of education: Not on file  . Highest education level: Some college, no degree  Occupational History  . Occupation: Scientist, water quality  Tobacco Use  . Smoking status: Never Smoker  . Smokeless tobacco: Never Used  Substance and Sexual Activity  . Alcohol use: Never  . Drug use: Never  . Sexual activity: Yes    Birth control/protection: Implant    Comment: undecided   Other Topics Concern  . Not on file  Social History Narrative   Lives at home with family.   Right-handed.   1 cup caffeine per day.   Social Determinants of Health   Financial Resource Strain:   . Difficulty of Paying Living Expenses:   Food Insecurity:   . Worried About Charity fundraiser in the Last Year:   . Arboriculturist in the Last Year:   Transportation Needs:   . Film/video editor (Medical):   Marland Kitchen Lack of Transportation (Non-Medical):   Physical Activity:   . Days of Exercise per Week:   . Minutes of Exercise per Session:   Stress:   . Feeling of Stress :   Social Connections:   . Frequency of Communication with Friends and Family:   . Frequency of Social Gatherings with Friends and Family:   . Attends Religious Services:   . Active Member of Clubs or Organizations:   . Attends Archivist Meetings:   Marland Kitchen Marital Status:   Intimate Partner Violence:   . Fear of Current or Ex-Partner:   . Emotionally Abused:   Marland Kitchen Physically Abused:   . Sexually Abused:     Allergies:  Allergies  Allergen Reactions  . Rizatriptan Other (See Comments)    Chest tightness   . Sumatriptan Other (See Comments)    Worsening nausea  . Tizanidine Other (See Comments)    "  did not like the way it made her feel"    Medications: Prior to Admission medications   Medication Sig Start Date End Date Taking? Authorizing Provider  ALPRAZolam Duanne Moron) 0.5 MG tablet Take 1 tablet (0.5 mg total) by mouth 2 (two) times daily as needed for anxiety. 09/17/19  Yes [name], MD  butalbital-acetaminophen-caffeine (FIORICET) 9512269830 MG tablet Take one tablet twice daily as need to migraines. No more than 2 tabs daily. No more than 10 tabs monthly. 09/10/19  Yes Marcial Pacas, MD  escitalopram (LEXAPRO) 20 MG tablet Take 1 tablet (20 mg total) by mouth daily. 03/20/19  Yes [name], MD  etonogestrel (NEXPLANON) 68 MG IMPL implant 1 each by Subdermal route once.   Yes  [provider]  naproxen (NAPROSYN) 500 MG tablet Take 1 tablet (500 mg total) by mouth 2 (two) times daily with a meal. 09/24/19  Yes Jaynee Eagles, PA-C    Physical Exam Vitals:  Vitals:   10/01/19 1507  BP: (!) 100/58  Pulse: (!) 117   No LMP recorded.  General: NAD HEENT: normocephalic, anicteric Pulmonary: No increased work of breathing Breast symmetrical, no tenderness, no palpable nodules or masses, no skin or nipple retraction present, no nipple discharge.  No axillary or supraclavicular lymphadenopathy. Extremities: no edema, erythema, or tenderness Neurologic: Grossly intact Psychiatric: mood appropriate, affect full  Female chaperone present for breast portions of the physical exam  Assessment: 29 y.o. G1P1001 with possible early mastitis  Plan: Problem List Items Addressed This Visit    [place]    Visit Diagnoses    Mastitis, right, acute    -  Primary     1) Early mastitis - Nystatin cream for nipple if fungal compenent and Rx  Bactrim  2) A total of 15 minutes were spent in face-to-face contact with the patient during this encounter with over half of that time devoted to counseling and coordination of care.  3) Return in about 10 days (around 10/11/2019) for medication follow up.   [name], MD, [facility], [facility] 10/01/2019, 3:33 PM

## 2019-10-02 ENCOUNTER — Other Ambulatory Visit: Payer: Self-pay | Admitting: Obstetrics and Gynecology

## 2019-10-02 DIAGNOSIS — N61 Mastitis without abscess: Secondary | ICD-10-CM

## 2019-10-02 MED ORDER — HYDROCODONE-ACETAMINOPHEN 5-325 MG PO TABS: 1.0000 | ORAL_TABLET | Freq: Four times a day (QID) | ORAL | 0 refills | Status: DC | PRN

## 2019-10-02 NOTE — Progress Notes (Unsigned)
trama °

## 2019-10-02 NOTE — Telephone Encounter (Signed)
Warm compresses, emptying breast in the shower. Can take naproxen and tylenol. Will send rx for a few Norco. Please let patient know. Thanks. -Dr. Gilman Schmidt

## 2019-10-04 ENCOUNTER — Other Ambulatory Visit: Payer: Self-pay

## 2019-10-04 DIAGNOSIS — N61 Mastitis without abscess: Secondary | ICD-10-CM

## 2019-10-04 NOTE — Telephone Encounter (Signed)
I tried to refuse refill but it says I do not have authority

## 2019-10-06 DIAGNOSIS — N879 Dysplasia of cervix uteri, unspecified: Secondary | ICD-10-CM | POA: Insufficient documentation

## 2019-10-09 ENCOUNTER — Encounter: Payer: Self-pay | Admitting: Family Medicine

## 2019-10-10 ENCOUNTER — Ambulatory Visit (INDEPENDENT_AMBULATORY_CARE_PROVIDER_SITE_OTHER)
Admission: RE | Admit: 2019-10-10 | Discharge: 2019-10-10 | Disposition: A | Discharge status: Home / Self Care | Source: Ambulatory Visit

## 2019-10-10 DIAGNOSIS — M546 Pain in thoracic spine: Secondary | ICD-10-CM

## 2019-10-10 DIAGNOSIS — M542 Cervicalgia: Secondary | ICD-10-CM | POA: Diagnosis not present

## 2019-10-10 NOTE — Discharge Instructions (Addendum)
Naproxen 440 mg twice as day for 5-7 days. Ice compress. This can take up to 3-4 weeks to completely resolve, but you should be feeling better each week. Follow up with PCP/orthopedics if symptoms worsen, changes for reevaluation. If experience trouble breathing, trouble swallowing, go to the ED for further evaluation.   [facility] at [facility]) [place], [place], [place] [address] [phone]  [facility] at [facility] [address] [place], [place] [address] [phone]  [facility] at [facility] [address] [place], [place] [address] [phone]  [facility] [place], [place], [place], [place] [address] [phone]  [facility] at [place] [address] [place], [place] [address] [phone]  [facility] at [address], [place], [place] [address] [phone]

## 2019-10-10 NOTE — ED Provider Notes (Signed)
Virtual Visit via Video Note:  Vickie Manning  initiated request for Telemedicine visit with [facility] Urgent Care team. I connected with Vickie Manning  on 10/10/2019 at 10:58 AM  for a synchronized telemedicine visit using a video enabled HIPPA compliant telemedicine application. I verified that I am speaking with Vickie Manning  using two identifiers. Vickie Jodell Cipro, PA-C  was physically located in a [facility] Urgent care site and Vickie Manning was located at a different location.   The limitations of evaluation and management by telemedicine as well as the availability of in-person appointments were discussed. Patient was informed that she  may incur a bill ( including co-pay) for this virtual visit encounter. Vickie Manning  expressed understanding and gave verbal consent to proceed with virtual visit.     History of Present Illness:Vickie Manning  is a 29 y.o. female presents with 3-4 day history of right neck pain after injury. Was at indoor skydiving when she slammed into the wall during the activity. Hit right shoulder, and then to the back. Had neck and pain and headache after incident. Now with pain to right anterior neck when swallowing. Denies swelling, erythema, contusion. Was lifting son at pediatrician's office when she felt right thoracic back pain and was suggested to be seen. Denies numbness/tingling to the fingers, loss of grip strength. Naproxen, advil with some relief.   Past Medical History:  Diagnosis Date  . Anxiety   . Depression   . Migraines     Allergies  Allergen Reactions  . Rizatriptan Other (See Comments)    Chest tightness   . Sumatriptan Other (See Comments)    Worsening nausea  . Tizanidine Other (See Comments)    "did not like the way it made her feel"        Observations/Objective: General: Well appearing, nontoxic, no acute distress. Sitting comfortably. Head: Normocephalic, atraumatic Eye: No conjunctival injection, eyelid swelling. EOMI ENT: Mucus  membranes moist, no lip cracking. No obvious nasal drainage. Handling own secretions well without tripoding, drooling, trismus. Pulm: Speaking in full sentences without difficulty. Normal effort. No respiratory distress, accessory muscle use. MSK: no tenderness to self palpation along right clavicle.  Neuro: Normal mental status. Alert and oriented x 3.  Assessment and Plan: Patient without pain at rest. Pain during movement. Lower suspicion for fractures, though discussed will need in person evaluation to be sure. Patient would like to defer in person evaluation for now. Will continue NSAIDs, ice compress and continue to monitor. Return precautions given. Patient expresses understanding and agrees to plan.  Follow Up Instructions:    I discussed the assessment and treatment plan with the patient. The patient was provided an opportunity to ask questions and all were answered. The patient agreed with the plan and demonstrated an understanding of the instructions.   The patient was advised to call back or seek an in-person evaluation if the symptoms worsen or if the condition fails to improve as anticipated.  I provided 10 minutes of non-face-to-face time during this encounter.    Ok Edwards, PA-C  10/10/2019 10:58 AM         Ok Edwards, PA-C 10/10/19 1359

## 2019-10-12 ENCOUNTER — Other Ambulatory Visit: Payer: Self-pay

## 2019-10-12 ENCOUNTER — Encounter: Payer: Self-pay | Admitting: Obstetrics and Gynecology

## 2019-10-12 ENCOUNTER — Ambulatory Visit (INDEPENDENT_AMBULATORY_CARE_PROVIDER_SITE_OTHER): Admitting: Obstetrics and Gynecology

## 2019-10-12 DIAGNOSIS — N61 Mastitis without abscess: Secondary | ICD-10-CM | POA: Diagnosis not present

## 2019-10-12 NOTE — Progress Notes (Signed)
I connected with Vickie Manning on 10/12/19 at 11:10 AM EDT by telephone and verified that I am speaking with the correct person using two identifiers.   I discussed the limitations, risks, security and privacy concerns of performing an evaluation and management service by telephone and the availability of in person appointments. I also discussed with the patient that there may be a patient responsible charge related to this service. The patient expressed understanding and agreed to proceed.  The patient was at home I spoke with the patient from my workstation phone The names of people involved in this encounter were: Vickie Manning , and Vickie Manning    Obstetrics & Gynecology Office Visit   Chief Complaint:  Chief Complaint  Patient presents with  . Follow-up    Nexplanon, and Acute mastitis    History of Present Illness: 29 y.o. G1P1001 presenting for medication follow up for a diagnosis of mastitis.  She is currently being managed with antibiotics.   The patient reports resolution of symptoms.  On her current medication regimen the patient has achieved amenorrhea of nexplanon and breastfeeding.   She has not noted any side-effects or new symptoms.    Review of Systems: Review of Systems  Constitutional: Negative.   Cardiovascular: Negative for chest pain.  Skin: Negative.      Past Medical History:  Past Medical History:  Diagnosis Date  . Anxiety   . Depression   . Migraines     Past Surgical History:  Past Surgical History:  Procedure Laterality Date  . APPENDECTOMY    . DILATION AND CURETTAGE OF UTERUS N/A 11/03/2018   Procedure: DILATATION AND CURETTAGE;  Surgeon: Vickie Mood, MD;  Location: [facility] ORS;  Service: Gynecology;  Laterality: N/A;  . nexplanon    . WISDOM TOOTH EXTRACTION      Gynecologic History: No LMP recorded.  Obstetric History: G1P1001  Family History:  Family History  Problem Relation Age of Onset  . Healthy Mother   . Healthy  Father   . Endometriosis Sister     Social History:  Social History   Socioeconomic History  . Marital status: Married    Spouse name: RJ  . Number of children: 1  . Years of education: Not on file  . Highest education level: Some college, no degree  Occupational History  . Occupation: Scientist, water quality  Tobacco Use  . Smoking status: Never Smoker  . Smokeless tobacco: Never Used  Substance and Sexual Activity  . Alcohol use: Never  . Drug use: Never  . Sexual activity: Yes    Birth control/protection: Implant    Comment: undecided  Other Topics Concern  . Not on file  Social History Narrative   Lives at home with family.   Right-handed.   1 cup caffeine per day.   Social Determinants of Health   Financial Resource Strain:   . Difficulty of Paying Living Expenses:   Food Insecurity:   . Worried About Charity fundraiser in the Last Year:   . Arboriculturist in the Last Year:   Transportation Needs:   . Film/video editor (Medical):   Marland Kitchen Lack of Transportation (Non-Medical):   Physical Activity:   . Days of Exercise per Week:   . Minutes of Exercise per Session:   Stress:   . Feeling of Stress :   Social Connections:   . Frequency of Communication with Friends and Family:   . Frequency of Social Gatherings with Friends and  Family:   . Attends Religious Services:   . Active Member of Clubs or Organizations:   . Attends Archivist Meetings:   Marland Kitchen Marital Status:   Intimate Partner Violence:   . Fear of Current or Ex-Partner:   . Emotionally Abused:   Marland Kitchen Physically Abused:   . Sexually Abused:     Allergies:  Allergies  Allergen Reactions  . Rizatriptan Other (See Comments)    Chest tightness   . Sumatriptan Other (See Comments)    Worsening nausea  . Tizanidine Other (See Comments)    "did not like the way it made her feel"    Medications: Prior to Admission medications   Medication Sig Start Date End Date Taking? Authorizing Provider   ALPRAZolam Duanne Moron) 0.5 MG tablet Take 1 tablet (0.5 mg total) by mouth 2 (two) times daily as needed for anxiety. 09/17/19  Yes Vickie Mood, MD  escitalopram (LEXAPRO) 20 MG tablet Take 1 tablet (20 mg total) by mouth daily. 03/20/19  Yes Vickie Mood, MD  etonogestrel (NEXPLANON) 68 MG IMPL implant 1 each by Subdermal route once.   Yes [provider]  butalbital-acetaminophen-caffeine (FIORICET) 50-325-40 MG tablet Take one tablet twice daily as need to migraines. No more than 2 tabs daily. No more than 10 tabs monthly. 09/10/19   Marcial Pacas, MD  naproxen (NAPROSYN) 500 MG tablet Take 1 tablet (500 mg total) by mouth 2 (two) times daily with a meal. 09/24/19   Jaynee Eagles, PA-C    Physical Exam Vitals: There were no vitals filed for this visit. No LMP recorded.  No physical exam as this was a remote telephone visit to promote social distancing during the current COVID-19 Pandemic   Assessment: 29 y.o. G1P1001 follow up mastitis  Plan: Problem List Items Addressed This Visit    None    Visit Diagnoses    Mastitis    -  Primary     1) Mastitis - resolution of symptoms.  May continue to use nystatin cream prn irritation  2) Telephone time 9:02 minutes  3) Return if symptoms worsen or fail to improve.   Vickie Mood, MD, [facility], [facility] 10/12/2019, 11:41 AM

## 2019-10-14 ENCOUNTER — Encounter: Payer: Self-pay | Admitting: Family Medicine

## 2019-10-15 ENCOUNTER — Ambulatory Visit (INDEPENDENT_AMBULATORY_CARE_PROVIDER_SITE_OTHER): Admitting: Family Medicine

## 2019-10-15 ENCOUNTER — Other Ambulatory Visit: Payer: Self-pay | Admitting: Obstetrics and Gynecology

## 2019-10-15 ENCOUNTER — Encounter: Payer: Self-pay | Admitting: Family Medicine

## 2019-10-15 ENCOUNTER — Other Ambulatory Visit: Payer: Self-pay

## 2019-10-15 VITALS — BP 100/66 | HR 96 | Temp 99.0°F | Resp 15 | Ht 66.0 in | Wt 121.0 lb

## 2019-10-15 DIAGNOSIS — L989 Disorder of the skin and subcutaneous tissue, unspecified: Secondary | ICD-10-CM

## 2019-10-15 DIAGNOSIS — D485 Neoplasm of uncertain behavior of skin: Secondary | ICD-10-CM | POA: Diagnosis not present

## 2019-10-15 NOTE — Telephone Encounter (Signed)
Advise

## 2019-10-15 NOTE — Progress Notes (Signed)
Subjective:    Patient ID: Vickie Manning, female    DOB: [birth year], 29 y.o.   MRN: [id number]  HPI Patient presents today with a new mole that has developed just above her right knee.  She is only noticed it for a few days however she is concerned about possible skin cancer.  There is an irregularly shaped mole approximately 2 mm in diameter.  Past Medical History:  Diagnosis Date  . Anxiety   . Depression   . Migraines    Past Surgical History:  Procedure Laterality Date  . APPENDECTOMY    . DILATION AND CURETTAGE OF UTERUS N/A 11/03/2018   Procedure: DILATATION AND CURETTAGE;  Surgeon: [name], MD;  Location: [facility] ORS;  Service: Gynecology;  Laterality: N/A;  . nexplanon    . WISDOM TOOTH EXTRACTION     Current Outpatient Medications on File Prior to Visit  Medication Sig Dispense Refill  . ALPRAZolam (XANAX) 0.5 MG tablet Take 1 tablet (0.5 mg total) by mouth 2 (two) times daily as needed for anxiety. 40 tablet 0  . butalbital-acetaminophen-caffeine (FIORICET) 50-325-40 MG tablet Take one tablet twice daily as need to migraines. No more than 2 tabs daily. No more than 10 tabs monthly. 10 tablet 5  . escitalopram (LEXAPRO) 20 MG tablet Take 1 tablet (20 mg total) by mouth daily. 30 tablet 11  . etonogestrel (NEXPLANON) 68 MG IMPL implant 1 each by Subdermal route once.    . fluconazole (DIFLUCAN) 150 MG tablet TAKE 1 TABLET BY MOUTH FOR ONE DOSE. MAY REPEAT DOSE IN THREE DAYS IF SYMPTOMS PERSIST. 2 tablet 0   No current facility-administered medications on file prior to visit.   Allergies  Allergen Reactions  . Rizatriptan Other (See Comments)    Chest tightness   . Sumatriptan Other (See Comments)    Worsening nausea  . Tizanidine Other (See Comments)    "did not like the way it made her feel"   Social History   Socioeconomic History  . Marital status: Married    Spouse name: RJ  . Number of children: 1  . Years of education: Not on file  . Highest  education level: Some college, no degree  Occupational History  . Occupation: Scientist, water quality  Tobacco Use  . Smoking status: Never Smoker  . Smokeless tobacco: Never Used  Substance and Sexual Activity  . Alcohol use: Never  . Drug use: Never  . Sexual activity: Yes    Birth control/protection: Implant    Comment: undecided  Other Topics Concern  . Not on file  Social History Narrative   Lives at home with family.   Right-handed.   1 cup caffeine per day.   Social Determinants of Health   Financial Resource Strain:   . Difficulty of Paying Living Expenses:   Food Insecurity:   . Worried About Charity fundraiser in the Last Year:   . Arboriculturist in the Last Year:   Transportation Needs:   . Film/video editor (Medical):   Marland Kitchen Lack of Transportation (Non-Medical):   Physical Activity:   . Days of Exercise per Week:   . Minutes of Exercise per Session:   Stress:   . Feeling of Stress :   Social Connections:   . Frequency of Communication with Friends and Family:   . Frequency of Social Gatherings with Friends and Family:   . Attends Religious Services:   . Active Member of Clubs or Organizations:   .  Attends Archivist Meetings:   Marland Kitchen Marital Status:   Intimate Partner Violence:   . Fear of Current or Ex-Partner:   . Emotionally Abused:   Marland Kitchen Physically Abused:   . Sexually Abused:      Review of Systems  Skin: Positive for rash.  All other systems reviewed and are negative.      Objective:   Physical Exam Constitutional:      Appearance: Normal appearance. She is normal weight.  Cardiovascular:     Rate and Rhythm: Normal rate and regular rhythm.     Pulses: Normal pulses.     Heart sounds: Normal heart sounds.  Pulmonary:     Effort: Pulmonary effort is normal.     Breath sounds: Normal breath sounds.  Musculoskeletal:       Legs:  Neurological:     Mental Status: She is alert.           Assessment & Plan:  Skin lesion of  lower extremity - Plan: Pathology Report ([facility])  2 mm dark black spot with irregular borders just above the right knee.  I gave the patient the option of clinical monitoring over the next 2 to 3 weeks to see if there is any change or skin biopsy.  She elects to perform skin biopsy.  The area was anesthetized with 0.1% lidocaine with epinephrine.  Shave biopsy was taken of the entire lesion and sent to pathology in a labeled container.  Await the results of the biopsy further decision management is made.  Hemostasis was achieved with Drysol and a Band-Aid.

## 2019-10-17 ENCOUNTER — Encounter: Payer: Self-pay | Admitting: Family Medicine

## 2019-10-18 LAB — TISSUE SPECIMEN

## 2019-10-18 LAB — PATHOLOGY REPORT

## 2019-10-21 ENCOUNTER — Other Ambulatory Visit: Payer: Self-pay

## 2019-10-21 ENCOUNTER — Ambulatory Visit (INDEPENDENT_AMBULATORY_CARE_PROVIDER_SITE_OTHER)

## 2019-10-21 ENCOUNTER — Encounter: Payer: Self-pay | Admitting: Family Medicine

## 2019-10-21 ENCOUNTER — Ambulatory Visit
Admission: EM | Admit: 2019-10-21 | Discharge: 2019-10-21 | Disposition: A | Discharge status: Home / Self Care | Attending: Physician Assistant | Admitting: Physician Assistant

## 2019-10-21 DIAGNOSIS — M25561 Pain in right knee: Secondary | ICD-10-CM | POA: Diagnosis not present

## 2019-10-21 DIAGNOSIS — W108XXA Fall (on) (from) other stairs and steps, initial encounter: Secondary | ICD-10-CM | POA: Diagnosis not present

## 2019-10-21 MED ORDER — DICLOFENAC SODIUM 50 MG PO TBEC: 50.0000 mg | DELAYED_RELEASE_TABLET | Freq: Two times a day (BID) | ORAL | 0 refills | Status: AC

## 2019-10-21 NOTE — Discharge Instructions (Signed)
Xray negative for fracture or dislocation. Start Diclofenac. Do not take ibuprofen (motrin/advil)/ naproxen (aleve) while on diclofenac. Ice compress, elevation, rest. This may take a few weeks to completely resolve, but should be feeling better each week. Follow up with PCP/sports medicine if symptoms not improving.

## 2019-10-21 NOTE — ED Triage Notes (Signed)
Patient is here for evaluation of right knee injury after slipping on the stairs at home yesterday.

## 2019-10-21 NOTE — ED Provider Notes (Signed)
[facility]    CSN: [id number] Arrival date & time: 10/21/19  0815      History   Chief Complaint Chief Complaint  Patient presents with  . Knee Injury    right    HPI Vickie Manning is a 29 y.o. female.   29 year old female comes in for 2 day history of right knee pain after injury. Was going upstairs when she slipped and fell, with direct impact to right knee. Had pain, but was able to continue to ambulate without difficulty. However, few hours after injury, noticed trouble flexing knee and now with painful weightbearing. Alternated ice/heat and took naproxen/tylenol without relief.      Past Medical History:  Diagnosis Date  . Anxiety   . Depression   . Migraines     Patient Active Problem List   Diagnosis Date Noted  . Cervical dysplasia 10/06/2019  . Chronic migraine 06/26/2019  . Hemorrhoids 06/02/2018  . Anxiety disorder 11/29/2017    Past Surgical History:  Procedure Laterality Date  . APPENDECTOMY    . DILATION AND CURETTAGE OF UTERUS N/A 11/03/2018   Procedure: DILATATION AND CURETTAGE;  Surgeon: [name], MD;  Location: [facility] ORS;  Service: Gynecology;  Laterality: N/A;  . nexplanon    . WISDOM TOOTH EXTRACTION      OB History    Gravida  1   Para  1   Term  1   Preterm  0   AB      Living  1     SAB      TAB      Ectopic      Multiple  0   Live Births  1            Home Medications    Prior to Admission medications   Medication Sig Start Date End Date Taking? Authorizing Provider  ALPRAZolam Duanne Moron) 0.5 MG tablet Take 1 tablet (0.5 mg total) by mouth 2 (two) times daily as needed for anxiety. 09/17/19   [name], MD  butalbital-acetaminophen-caffeine (FIORICET) [phone] MG tablet Take one tablet twice daily as need to migraines. No more than 2 tabs daily. No more than 10 tabs monthly. 09/10/19   Marcial Pacas, MD  diclofenac (VOLTAREN) 50 MG EC tablet Take 1 tablet (50 mg total) by mouth 2 (two)  times daily. 10/21/19   Tasia Catchings, Arya Boxley V, PA-C  escitalopram (LEXAPRO) 20 MG tablet Take 1 tablet (20 mg total) by mouth daily. 03/20/19   [name], MD  etonogestrel (NEXPLANON) 68 MG IMPL implant 1 each by Subdermal route once.    [provider]    Family History Family History  Problem Relation Age of Onset  . Healthy Mother   . Healthy Father   . Endometriosis Sister     Social History Social History   Tobacco Use  . Smoking status: Never Smoker  . Smokeless tobacco: Never Used  Substance Use Topics  . Alcohol use: Never  . Drug use: Never     Allergies   Rizatriptan, Sumatriptan, and Tizanidine   Review of Systems Review of Systems  Reason unable to perform ROS: See HPI as above.     Physical Exam Triage Vital Signs ED Triage Vitals [10/21/19 0819]  Enc Vitals Group     BP 112/72     Pulse Rate 84     Resp 16     Temp 98.5 F (36.9 C)     Temp Source Oral  SpO2 98 %     Weight      Height      Head Circumference      Peak Flow      Pain Score      Pain Loc      Pain Edu?      Excl. in [place]?    No data found.  Updated Vital Signs BP 112/72 (BP Location: Left Arm)   Pulse 84   Temp 98.5 F (36.9 C) (Oral)   Resp 16   SpO2 98%   Breastfeeding No   Physical Exam Constitutional:      General: She is not in acute distress.    Appearance: Normal appearance. She is well-developed. She is not toxic-appearing or diaphoretic.  HENT:     Head: Normocephalic and atraumatic.  Eyes:     Conjunctiva/sclera: Conjunctivae normal.     Pupils: Pupils are equal, round, and reactive to light.  Pulmonary:     Effort: Pulmonary effort is normal. No respiratory distress.     Comments: Speaking in full sentences without difficulty Musculoskeletal:     Cervical back: Normal range of motion and neck supple.     Comments: Contusion to patellar. Bandaid superior of patellar from recent biopsy. Diffuse tenderness with crepitus on palpation of patellar.  No other tenderness to palpation. Able to flex knee 90 degrees, though with pain. NVI  Skin:    General: Skin is warm and dry.  Neurological:     Mental Status: She is alert and oriented to person, place, and time.      UC Treatments / Results  Labs (all labs ordered are listed, but only abnormal results are displayed) Labs Reviewed - No data to display  EKG   Radiology DG Knee AP/LAT W/Sunrise Right  Result Date: 10/21/2019 CLINICAL DATA:  Right anterior knee pain after fall. EXAM: RIGHT KNEE 3 VIEWS COMPARISON:  None. FINDINGS: No evidence of fracture, dislocation, or joint effusion. No evidence of arthropathy or other focal bone abnormality. Soft tissues are unremarkable. IMPRESSION: Negative. Electronically Signed   By: Aletta Edouard M.D.   On: 10/21/2019 08:50    Procedures Procedures (including critical care time)  Medications Ordered in UC Medications - No data to display  Initial Impression / Assessment and Plan / UC Course  I have reviewed the triage vital signs and the nursing notes.  Pertinent labs & imaging results that were available during my care of the patient were reviewed by me and considered in my medical decision making (see chart for details).    X-ray negative for fracture or dislocation.  NSAIDs, ice compress, elevation, rest.  Knee sleeve during activity if needed.  Expected course of healing discussed.  Return precautions given.  Final Clinical Impressions(s) / UC Diagnoses   Final diagnoses:  Acute pain of right knee    ED Prescriptions    Medication Sig Dispense Auth. Provider   diclofenac (VOLTAREN) 50 MG EC tablet Take 1 tablet (50 mg total) by mouth 2 (two) times daily. 20 tablet Ok Edwards, PA-C     I have reviewed the PDMP during this encounter.   Ok Edwards, PA-C 10/21/19 [phone]

## 2019-10-23 ENCOUNTER — Encounter: Payer: Self-pay | Admitting: Family Medicine

## 2019-10-24 ENCOUNTER — Encounter: Payer: Self-pay | Admitting: Family Medicine

## 2019-10-24 DIAGNOSIS — M25561 Pain in right knee: Secondary | ICD-10-CM

## 2019-10-26 ENCOUNTER — Encounter: Payer: Self-pay | Admitting: Family Medicine

## 2019-10-26 ENCOUNTER — Other Ambulatory Visit: Payer: Self-pay | Admitting: Family Medicine

## 2019-10-26 MED ORDER — TRAMADOL HCL 50 MG PO TABS: 50.0000 mg | ORAL_TABLET | Freq: Three times a day (TID) | ORAL | 0 refills | Status: AC | PRN

## 2019-10-30 ENCOUNTER — Other Ambulatory Visit: Payer: Self-pay | Admitting: Family Medicine

## 2019-10-30 NOTE — Telephone Encounter (Signed)
Requested Prescriptions   Pending Prescriptions Disp Refills  . traMADol (ULTRAM) 50 MG tablet [Pharmacy Med Name: TRAMADOL 50MG  TABLETS] 15 tablet 0    Sig: TAKE 1 TABLET(50 MG) BY MOUTH EVERY 8 HOURS FOR UP TO 5 DAYS AS NEEDED     Last OV 10/15/2019   Last written 10/26/2019

## 2019-11-02 ENCOUNTER — Other Ambulatory Visit: Payer: Self-pay

## 2019-11-02 ENCOUNTER — Encounter (HOSPITAL_COMMUNITY): Payer: Self-pay

## 2019-11-02 ENCOUNTER — Ambulatory Visit (HOSPITAL_COMMUNITY)
Admission: EM | Admit: 2019-11-02 | Discharge: 2019-11-02 | Disposition: A | Discharge status: Home / Self Care | Attending: Physician Assistant | Admitting: Physician Assistant

## 2019-11-02 DIAGNOSIS — S8001XD Contusion of right knee, subsequent encounter: Secondary | ICD-10-CM | POA: Diagnosis not present

## 2019-11-02 DIAGNOSIS — M25561 Pain in right knee: Secondary | ICD-10-CM

## 2019-11-02 MED ORDER — PREDNISONE 10 MG (21) PO TBPK: ORAL_TABLET | ORAL | 0 refills | Status: AC
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
## 2019-11-02 NOTE — Discharge Instructions (Addendum)
Follow up with ortho. Take medication as prescribed. Continue RICE therapy.
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
## 2019-11-03 ENCOUNTER — Other Ambulatory Visit (HOSPITAL_COMMUNITY): Payer: Self-pay | Admitting: Physician Assistant

## 2019-11-03 MED ORDER — MELOXICAM 7.5 MG PO TABS: 7.5000 mg | ORAL_TABLET | Freq: Every day | ORAL | 0 refills | Status: AC

## 2019-11-05 ENCOUNTER — Encounter: Payer: Self-pay | Admitting: Family Medicine

## 2019-11-05 ENCOUNTER — Other Ambulatory Visit: Payer: Self-pay | Admitting: Obstetrics and Gynecology

## 2019-11-05 DIAGNOSIS — M25562 Pain in left knee: Secondary | ICD-10-CM

## 2019-11-05 NOTE — Progress Notes (Signed)
Patient with gradually worsening left knee pain.  Has not established with new PCP so requesting referral

## 2019-11-06 ENCOUNTER — Ambulatory Visit (INDEPENDENT_AMBULATORY_CARE_PROVIDER_SITE_OTHER): Admitting: Nurse Practitioner

## 2019-11-06 DIAGNOSIS — Z5329 Procedure and treatment not carried out because of patient's decision for other reasons: Secondary | ICD-10-CM

## 2019-11-06 NOTE — Progress Notes (Signed)
No show

## 2019-12-01 ENCOUNTER — Ambulatory Visit (HOSPITAL_COMMUNITY)
Admission: RE | Admit: 2019-12-01 | Discharge: 2019-12-01 | Disposition: A | Discharge status: Home / Self Care | Source: Ambulatory Visit | Attending: Emergency Medicine | Admitting: Emergency Medicine

## 2019-12-01 ENCOUNTER — Encounter (HOSPITAL_COMMUNITY): Payer: Self-pay

## 2019-12-01 ENCOUNTER — Other Ambulatory Visit: Payer: Self-pay

## 2019-12-01 VITALS — BP 128/72 | HR 62 | Temp 98.2°F | Resp 16

## 2019-12-01 DIAGNOSIS — M25561 Pain in right knee: Secondary | ICD-10-CM | POA: Diagnosis not present

## 2019-12-01 MED ORDER — IBUPROFEN 800 MG PO TABS: 800.0000 mg | ORAL_TABLET | Freq: Three times a day (TID) | ORAL | 0 refills | Status: AC

## 2019-12-01 MED ORDER — HYDROCODONE-ACETAMINOPHEN 5-325 MG PO TABS: 1.0000 | ORAL_TABLET | Freq: Four times a day (QID) | ORAL | 0 refills | Status: DC | PRN

## 2019-12-01 NOTE — Discharge Instructions (Signed)
Use anti-inflammatories for pain/swelling. You may take up to 800 mg Ibuprofen every 8 hours with food. You may supplement Ibuprofen with Tylenol [phone] mg every 4-6 hours.   Hydrocodone for severe pain  Follow up with ortho/PT as planned

## 2019-12-01 NOTE — ED Triage Notes (Addendum)
Pt presents to UC for pain management of right knee. Pt currently being followed by ortho and PT. Pt has tried myriad of pain meds with out relief or with side effects. Pt called on nurse at ortho clinic and advised to come here. Pt states pain in right knee is unbearable. Pt ambulated into treatment space with even steady gait, unassisted. Pt denies meds PTA. Pt states she originally injured her knee years ago but re injured 1 month ago by falling down stairs.

## 2019-12-01 NOTE — ED Provider Notes (Signed)
[facility]    CSN: [id number] Arrival date & time: 12/01/19  1105      History   Chief Complaint Chief Complaint  Patient presents with   Knee Pain    HPI Vickie Manning is a 29 y.o. female presenting today for evaluation of knee pain.  Patient has had knee pain over the past 2 months after an injury where she fell on the stairs and landed on hardwood floor.  She has been seen by orthopedics and was noted to have torn cartilage behind her patella.  She has been going through physical therapy for this, but is unsure if she may end up needing surgery.  She has been using ibuprofen and Tylenol as well as has had some Norco prescribed by orthopedics.  She has ran out of this and given the weekend she has been unable to get a refill.  Pain not managed with ibuprofen and Tylenol alone.  At recent physical therapy appointment had manipulation techniques that she felt worsened her symptoms.  HPI  Past Medical History:  Diagnosis Date   Anxiety    Depression    Migraines     Patient Active Problem List   Diagnosis Date Noted   Cervical dysplasia 10/06/2019   Chronic migraine 06/26/2019   Hemorrhoids 06/02/2018   Anxiety disorder 11/29/2017    Past Surgical History:  Procedure Laterality Date   APPENDECTOMY     DILATION AND CURETTAGE OF UTERUS N/A 11/03/2018   Procedure: DILATATION AND CURETTAGE;  Surgeon: [name], MD;  Location: [facility] ORS;  Service: Gynecology;  Laterality: N/A;   nexplanon     WISDOM TOOTH EXTRACTION      OB History    Gravida  1   Para  1   Term  1   Preterm  0   AB      Living  1     SAB      TAB      Ectopic      Multiple  0   Live Births  1            Home Medications    Prior to Admission medications   Medication Sig Start Date End Date Taking? Authorizing Provider  ALPRAZolam Duanne Moron) 0.5 MG tablet Take 1 tablet (0.5 mg total) by mouth 2 (two) times daily as needed for anxiety. 09/17/19  Yes  [name], MD  butalbital-acetaminophen-caffeine (FIORICET) [phone] MG tablet Take one tablet twice daily as need to migraines. No more than 2 tabs daily. No more than 10 tabs monthly. 09/10/19  Yes Marcial Pacas, MD  escitalopram (LEXAPRO) 20 MG tablet Take 1 tablet (20 mg total) by mouth daily. 03/20/19  Yes [name], MD  diclofenac (VOLTAREN) 50 MG EC tablet Take 1 tablet (50 mg total) by mouth 2 (two) times daily. 10/21/19   Ok Edwards, PA-C  etonogestrel (NEXPLANON) 68 MG IMPL implant 1 each by Subdermal route once.    [provider]  HYDROcodone-acetaminophen (NORCO/VICODIN) 5-325 MG tablet Take 1-2 tablets by mouth every 6 (six) hours as needed for severe pain. 12/01/19   Abdifatah Colquhoun C, PA-C  ibuprofen (ADVIL) 800 MG tablet Take 1 tablet (800 mg total) by mouth 3 (three) times daily. 12/01/19   Kwamane Whack C, PA-C  meloxicam (MOBIC) 7.5 MG tablet Take 1 tablet (7.5 mg total) by mouth daily. 11/03/19   Peri Jefferson, PA-C  predniSONE (STERAPRED UNI-PAK 21 TAB) 10 MG (21) TBPK tablet Take 6 tabs by  mouth daily  for 2 days, then 5 tabs for 2 days, then 4 tabs for 2 days, then 3 tabs for 2 days, 2 tabs for 2 days, then 1 tab by mouth daily for 2 days 11/02/19   Peri Jefferson, PA-C    Family History Family History  Problem Relation Age of Onset   Healthy Mother    Healthy Father    Endometriosis Sister     Social History Social History   Tobacco Use   Smoking status: Never Smoker   Smokeless tobacco: Never Used  Scientific laboratory technician Use: Never used  Substance Use Topics   Alcohol use: Never   Drug use: Never     Allergies   Rizatriptan, Sumatriptan, and Tizanidine   Review of Systems Review of Systems  Constitutional: Negative for fatigue and fever.  Eyes: Negative for visual disturbance.  Respiratory: Negative for shortness of breath.   Cardiovascular: Negative for chest pain.  Gastrointestinal: Negative for abdominal pain, nausea  and vomiting.  Musculoskeletal: Positive for arthralgias. Negative for joint swelling.  Skin: Positive for color change and rash. Negative for wound.  Neurological: Negative for dizziness, weakness, light-headedness and headaches.     Physical Exam Triage Vital Signs ED Triage Vitals  Enc Vitals Group     BP 12/01/19 1133 128/72     Pulse Rate 12/01/19 1133 62     Resp 12/01/19 1133 16     Temp 12/01/19 1133 98.2 F (36.8 C)     Temp Source 12/01/19 1133 Oral     SpO2 12/01/19 1133 100 %     Weight --      Height --      Head Circumference --      Peak Flow --      Pain Score 12/01/19 1134 8     Pain Loc --      Pain Edu? --      Excl. in [place]? --    No data found.  Updated Vital Signs BP 128/72 (BP Location: Right Arm)    Pulse 62    Temp 98.2 F (36.8 C) (Oral)    Resp 16    SpO2 100%   Visual Acuity Right Eye Distance:   Left Eye Distance:   Bilateral Distance:    Right Eye Near:   Left Eye Near:    Bilateral Near:     Physical Exam Vitals and nursing note reviewed.  Constitutional:      Appearance: She is well-developed.     Comments: No acute distress  HENT:     Head: Normocephalic and atraumatic.     Nose: Nose normal.  Eyes:     Conjunctiva/sclera: Conjunctivae normal.  Cardiovascular:     Rate and Rhythm: Normal rate.  Pulmonary:     Effort: Pulmonary effort is normal. No respiratory distress.  Abdominal:     General: There is no distension.  Musculoskeletal:        General: Normal range of motion.     Cervical back: Neck supple.     Comments: Right knee: No obvious swelling, tender to palpation and manipulation of patella, nontender along medial and lateral joint line, full flexion and extension, palpable popping/catching noted with flexion and extension  Skin:    General: Skin is warm and dry.  Neurological:     Mental Status: She is alert and oriented to person, place, and time.      UC Treatments / Results  Labs (all labs ordered  are  listed, but only abnormal results are displayed) Labs Reviewed - No data to display  EKG   Radiology No results found.  Procedures Procedures (including critical care time)  Medications Ordered in UC Medications - No data to display  Initial Impression / Assessment and Plan / UC Course  I have reviewed the triage vital signs and the nursing notes.  Pertinent labs & imaging results that were available during my care of the patient were reviewed by me and considered in my medical decision making (see chart for details).     Patient with what sounds like likely osteochondral defect, continue Tylenol and ibuprofen, provided refill of hydrocodone, advised to follow-up with Ortho and PT as planned as well as to follow-up with him for further refills.  Discussed strict return precautions. Patient verbalized understanding and is agreeable with plan.  Final Clinical Impressions(s) / UC Diagnoses   Final diagnoses:  Acute pain of right knee     Discharge Instructions     Use anti-inflammatories for pain/swelling. You may take up to 800 mg Ibuprofen every 8 hours with food. You may supplement Ibuprofen with Tylenol [phone] every 4-6 hours.   Hydrocodone for severe pain  Follow up with ortho/PT as planned    ED Prescriptions    Medication Sig Dispense Auth. Provider   ibuprofen (ADVIL) 800 MG tablet Take 1 tablet (800 mg total) by mouth 3 (three) times daily. 60 tablet Sophia Cubero C, PA-C   HYDROcodone-acetaminophen (NORCO/VICODIN) 5-325 MG tablet Take 1-2 tablets by mouth every 6 (six) hours as needed for severe pain. 15 tablet Mahari Strahm, [place] C, PA-C     I have reviewed the PDMP during this encounter.   Janith Lima, [place] 12/01/19 1157

## 2019-12-18 NOTE — Telephone Encounter (Signed)
Pt calling; has sent msg via MyChart and hasn't heard anything; hemorrhoids are very painful; having pain in anal canal as well; otc pain med and ointment not quite relieving it.  Is waiting on an appt c GI.  [facility] [place] and [facility].  [phone]  Sent msg to pt AMS is on call today.

## 2019-12-19 ENCOUNTER — Ambulatory Visit
Admission: EM | Admit: 2019-12-19 | Discharge: 2019-12-19 | Disposition: A | Discharge status: Home / Self Care | Attending: Family Medicine | Admitting: Family Medicine

## 2019-12-19 ENCOUNTER — Telehealth

## 2019-12-19 ENCOUNTER — Other Ambulatory Visit: Payer: Self-pay

## 2019-12-19 DIAGNOSIS — R1030 Lower abdominal pain, unspecified: Secondary | ICD-10-CM

## 2019-12-19 DIAGNOSIS — R102 Pelvic and perineal pain: Secondary | ICD-10-CM

## 2019-12-19 DIAGNOSIS — Z3202 Encounter for pregnancy test, result negative: Secondary | ICD-10-CM

## 2019-12-19 LAB — POCT URINALYSIS DIP (MANUAL ENTRY)
Bilirubin, UA: NEGATIVE
Blood, UA: NEGATIVE
Glucose, UA: NEGATIVE mg/dL
Ketones, POC UA: NEGATIVE mg/dL
Leukocytes, UA: NEGATIVE
Nitrite, UA: NEGATIVE
Protein Ur, POC: NEGATIVE mg/dL
Spec Grav, UA: 1.02 (ref 1.010–1.025)
Urobilinogen, UA: 0.2 E.U./dL
pH, UA: 5.5 (ref 5.0–8.0)

## 2019-12-19 LAB — POCT URINE PREGNANCY: Preg Test, Ur: NEGATIVE

## 2019-12-19 MED ORDER — METHOCARBAMOL 500 MG PO TABS: 500.0000 mg | ORAL_TABLET | Freq: Two times a day (BID) | ORAL | 0 refills | Status: AC

## 2019-12-19 MED ORDER — TRAMADOL HCL 50 MG PO TABS: 50.0000 mg | ORAL_TABLET | Freq: Four times a day (QID) | ORAL | 0 refills | Status: DC | PRN

## 2019-12-19 MED ORDER — METHOCARBAMOL 500 MG PO TABS: 500.0000 mg | ORAL_TABLET | Freq: Two times a day (BID) | ORAL | 0 refills | Status: DC

## 2019-12-19 NOTE — Discharge Instructions (Addendum)
I have sent in tramadol for you to take as needed for pain  I have also sent in robaxin for you to take as needed  Follow up with OB as scheduled, might be worth while to investigate endometriosis

## 2019-12-19 NOTE — Telephone Encounter (Signed)
Spoke with patient about her pain and she is experiencing low abdominal pain that does not seem to be relieved by anything. Discussed that I think that she would be better served seen in person. She is agreeable to this and will come to the UC in [place].

## 2019-12-19 NOTE — ED Provider Notes (Signed)
[place]   [id number] 12/19/19 Arrival Time: 2025  CC: ABDOMINAL PAIN  SUBJECTIVE:  Vickie Manning is a 29 y.o. female who presents with complaint of abdominal discomfort that began abrubtly about 3 days ago. Denies a precipitating event, trauma, close contacts with similar symptoms, recent travel or antibiotic use. Localizes pain to the lower abdomen. Describes as intermittent, and sharp in character. Has taken ibuprofen and tylenol with little relief. Denies alleviating or aggravating factors. Denies similar symptoms in the past. Last BM today. Reports that the pain feels like really bad menstrual cramping. Reports that she has not had a period in 2 years. Has a one year old baby. Has nexplanon implant.  Denies fever, chills, appetite changes, weight changes, nausea, vomiting, chest pain, SOB, diarrhea, constipation, hematochezia, melena, dysuria, difficulty urinating, increased frequency or urgency, flank pain, loss of bowel or bladder function, vaginal discharge, vaginal odor, vaginal bleeding, dyspareunia, pelvic pain.     ROS: As per HPI.  All other pertinent ROS negative.     Past Medical History:  Diagnosis Date   Anxiety    Depression    Migraines    Past Surgical History:  Procedure Laterality Date   APPENDECTOMY     DILATION AND CURETTAGE OF UTERUS N/A 11/03/2018   Procedure: DILATATION AND CURETTAGE;  Surgeon: [name], MD;  Location: [facility] OR[facility];  Service: Gynecology;  Laterality: N/A;   nexplanon     WISDOM TOOTH EXTRACTION     Allergies  Allergen Reactions   Rizatriptan Other (See Comments)    Chest tightness    Sumatriptan Other (See Comments)    Worsening nausea   Tizanidine Other (See Comments)    "did not like the way it made her feel"   No current facility-administered medications on file prior to encounter.   Current Outpatient Medications on File Prior to Encounter  Medication Sig Dispense Refill   escitalopram (LEXAPRO) 20  MG tablet Take 1 tablet (20 mg total) by mouth daily. 30 tablet 11   ALPRAZolam (XANAX) 0.5 MG tablet Take 1 tablet (0.5 mg total) by mouth 2 (two) times daily as needed for anxiety. 40 tablet 0   butalbital-acetaminophen-caffeine (FIORICET) 50-325-40 MG tablet Take one tablet twice daily as need to migraines. No more than 2 tabs daily. No more than 10 tabs monthly. 10 tablet 5   diclofenac (VOLTAREN) 50 MG EC tablet Take 1 tablet (50 mg total) by mouth 2 (two) times daily. 20 tablet 0   etonogestrel (NEXPLANON) 68 MG IMPL implant 1 each by Subdermal route once.     HYDROcodone-acetaminophen (NORCO/VICODIN) 5-325 MG tablet Take 1-2 tablets by mouth every 6 (six) hours as needed for severe pain. 15 tablet 0   ibuprofen (ADVIL) 800 MG tablet Take 1 tablet (800 mg total) by mouth 3 (three) times daily. 60 tablet 0   meloxicam (MOBIC) 7.5 MG tablet Take 1 tablet (7.5 mg total) by mouth daily. 20 tablet 0   predniSONE (STERAPRED UNI-PAK 21 TAB) 10 MG (21) TBPK tablet Take 6 tabs by mouth daily  for 2 days, then 5 tabs for 2 days, then 4 tabs for 2 days, then 3 tabs for 2 days, 2 tabs for 2 days, then 1 tab by mouth daily for 2 days 42 tablet 0   Social History   Socioeconomic History   Marital status: Married    Spouse name: [place]   Number of children: 1   Years of education: Not on file   Highest education level:  Some college, no degree  Occupational History   Occupation: Scientist, water quality  Tobacco Use   Smoking status: Never Smoker   Smokeless tobacco: Never Used  Vaping Use   Vaping Use: Never used  Substance and Sexual Activity   Alcohol use: Never   Drug use: Never   Sexual activity: Yes    Birth control/protection: Implant    Comment: undecided  Other Topics Concern   Not on file  Social History Narrative   Lives at home with family.   Right-handed.   1 cup caffeine per day.   Social Determinants of Health   Financial Resource Strain:    Difficulty of  Paying Living Expenses:   Food Insecurity:    Worried About Charity fundraiser in the Last Year:    Arboriculturist in the Last Year:   Transportation Needs:    Film/video editor (Medical):    Lack of Transportation (Non-Medical):   Physical Activity:    Days of Exercise per Week:    Minutes of Exercise per Session:   Stress:    Feeling of Stress :   Social Connections:    Frequency of Communication with Friends and Family:    Frequency of Social Gatherings with Friends and Family:    Attends Religious Services:    Active Member of Clubs or Organizations:    Attends Music therapist:    Marital Status:   Intimate Partner Violence:    Fear of Current or Ex-Partner:    Emotionally Abused:    Physically Abused:    Sexually Abused:    Family History  Problem Relation Age of Onset   Healthy Mother    Healthy Father    Endometriosis Sister      OBJECTIVE:  Vitals:   12/19/19 1544  BP: (!) 100/60  Pulse: 61  Resp: 16  Temp: 98 F (36.7 C)  TempSrc: Oral  SpO2: 98%    General appearance: Alert; NAD HEENT: NCAT.  Oropharynx clear.  Lungs: clear to auscultation bilaterally without adventitious breath sounds Heart: regular rate and rhythm.  Radial pulses 2+ symmetrical bilaterally Abdomen: soft, non-distended; normal active bowel sounds; non-tender to light and deep palpation; nontender at McBurney's point; negative Murphy's sign; negative rebound; no guarding GU: External examination without vulvar lesions or erythema  Bimanual exam: Negative for cervical motion or adenexal tenderness;  Speculum exam: No discharge noted during pelvic exam. Cervix visualized without erythema or lesions.  Back: no CVA tenderness Extremities: no edema; symmetrical with no gross deformities Skin: warm and dry Neurologic: normal gait Psychological: alert and cooperative; normal mood and affect  LABS: Results for orders placed or performed during the  hospital encounter of 12/19/19 (from the past 24 hour(s))  POCT urinalysis dipstick     Status: None   Collection Time: 12/19/19  3:52 PM  Result Value Ref Range   Color, UA yellow yellow   Clarity, UA clear clear   Glucose, UA negative negative mg/dL   Bilirubin, UA negative negative   Ketones, POC UA negative negative mg/dL   Spec Grav, UA 1.020 1.010 - 1.025   Blood, UA negative negative   pH, UA 5.5 5.0 - 8.0   Protein Ur, POC negative negative mg/dL   Urobilinogen, UA 0.2 0.2 or 1.0 E.U./dL   Nitrite, UA Negative Negative   Leukocytes, UA Negative Negative  POCT urine pregnancy     Status: None   Collection Time: 12/19/19  3:57 PM  Result Value  Ref Range   Preg Test, Ur Negative Negative    DIAGNOSTIC STUDIES: No results found.   ASSESSMENT & PLAN:  1. Lower abdominal pain     Meds ordered this encounter  Medications   DISCONTD: traMADol (ULTRAM) 50 MG tablet    Sig: Take 1 tablet (50 mg total) by mouth every 6 (six) hours as needed.    Dispense:  15 tablet    Refill:  0    Order Specific Question:   Supervising Provider    Answer:   Chase Picket [[id number]]   DISCONTD: methocarbamol (ROBAXIN) 500 MG tablet    Sig: Take 1 tablet (500 mg total) by mouth 2 (two) times daily.    Dispense:  20 tablet    Refill:  0    Order Specific Question:   Supervising Provider    Answer:   Chase Picket [[id number]]   traMADol (ULTRAM) 50 MG tablet    Sig: Take 1 tablet (50 mg total) by mouth every 6 (six) hours as needed.    Dispense:  15 tablet    Refill:  0    Order Specific Question:   Supervising Provider    Answer:   Chase Picket [[id number]]   methocarbamol (ROBAXIN) 500 MG tablet    Sig: Take 1 tablet (500 mg total) by mouth 2 (two) times daily.    Dispense:  20 tablet    Refill:  0    Order Specific Question:   Supervising Provider    Answer:   Chase Picket [[id number]]     Doubt this is GI in origin, there is no associated nausea, vomiting,  diarrhea or abdominal tenderness More likely reproductive in origin UA negative for infection Urine pregnancy negative in office today Discussed other causes such as endometriosis, etc Prescribed tramadol Prescribed robaxin Follow up with GYN If you experience new or worsening symptoms return or go to ER such as fever, chills, nausea, vomiting, diarrhea, bloody or dark tarry stools, constipation, urinary symptoms, worsening abdominal discomfort, symptoms that do not improve with medications, inability to keep fluids down.  Reviewed expectations re: course of current medical issues. Questions answered. Outlined signs and symptoms indicating need for more acute intervention. Patient verbalized understanding. After Visit Summary given.   Faustino Congress, NP 12/19/19 1707

## 2019-12-19 NOTE — ED Triage Notes (Signed)
Pt presents to UC for abdominal pain x3 days. Pt states it "feels like bad period cramps". PT points to middle lower abdomen when asked where pain is. Pt denies pain radiating to flanks, or new discharge. Pt denies n/v/d. PT denies OTC treatments, states Advil makes her stool frequently.

## 2020-02-27 ENCOUNTER — Other Ambulatory Visit: Payer: Self-pay | Admitting: Obstetrics and Gynecology

## 2020-02-27 MED ORDER — ESCITALOPRAM OXALATE 10 MG PO TABS: 10.0000 mg | ORAL_TABLET | Freq: Every day | ORAL | 3 refills | Status: AC

## 2020-03-05 ENCOUNTER — Other Ambulatory Visit: Payer: Self-pay | Admitting: Obstetrics and Gynecology

## 2020-03-05 DIAGNOSIS — R1030 Lower abdominal pain, unspecified: Secondary | ICD-10-CM

## 2020-03-05 MED ORDER — TRAMADOL HCL 50 MG PO TABS: 50.0000 mg | ORAL_TABLET | Freq: Two times a day (BID) | ORAL | 0 refills | Status: AC | PRN

## 2020-03-05 NOTE — Telephone Encounter (Signed)
Patient is requesting prescriptions sent to [facility] in [place]

## 2020-04-21 ENCOUNTER — Other Ambulatory Visit: Payer: Self-pay | Admitting: Obstetrics and Gynecology

## 2020-07-28 NOTE — Progress Notes (Signed)
Delayed chart closure, encounter marked as no show.   Adrian Prows MD, [facility], [facility] 07/28/2020 8:26 AM

## 2021-03-17 NOTE — Telephone Encounter (Signed)
Nexplanon rcvd/charged 09/14/19

## 2021-08-28 IMAGING — CR DG FOOT COMPLETE 3+V*L*
3 series · 3 of 3 positions shown · non-contrast
Comparison: None.

CLINICAL DATA: Great toe fracture. Left foot pain. Metal stand fell
on great toe with hematoma for 1 day.

EXAM:
LEFT FOOT - COMPLETE 3+ VIEW

[x foot ap left]
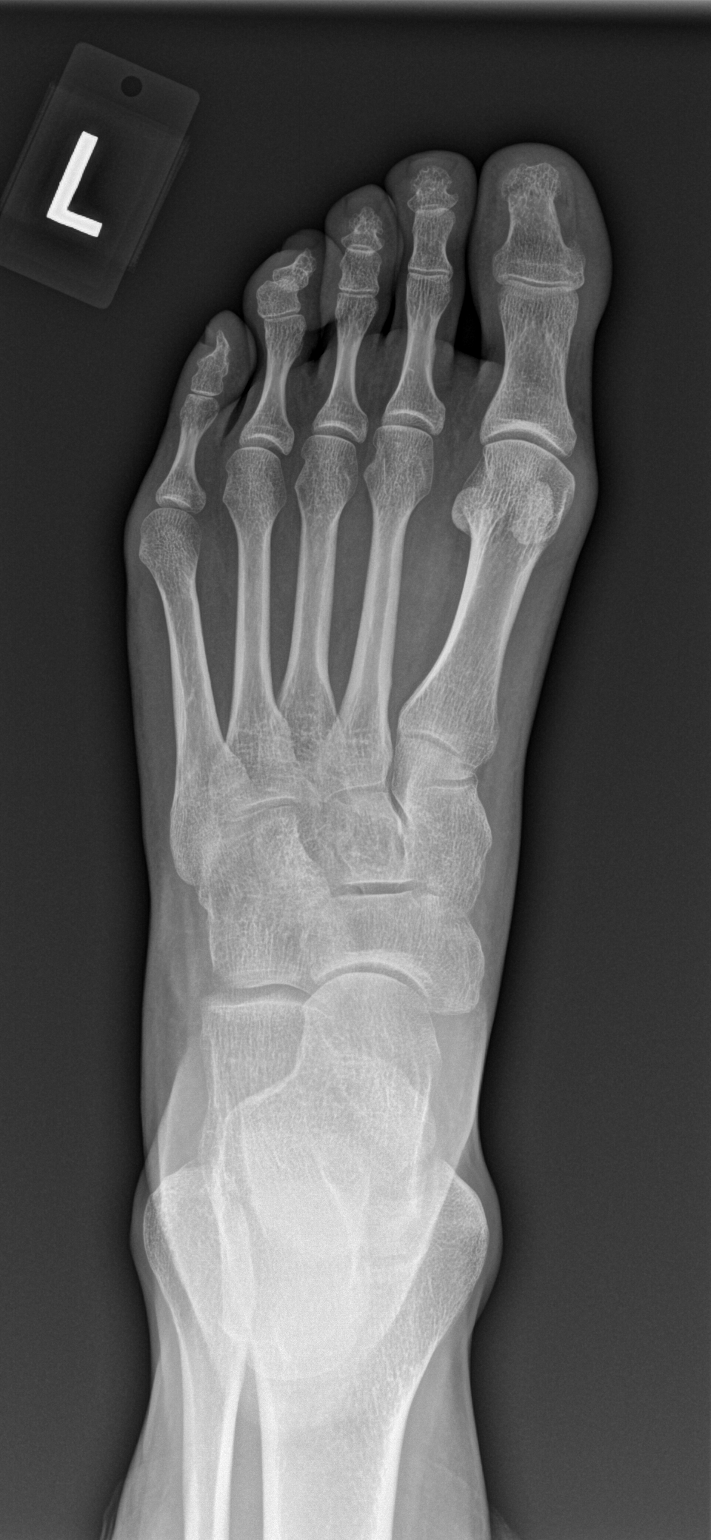

[x foot obl left]
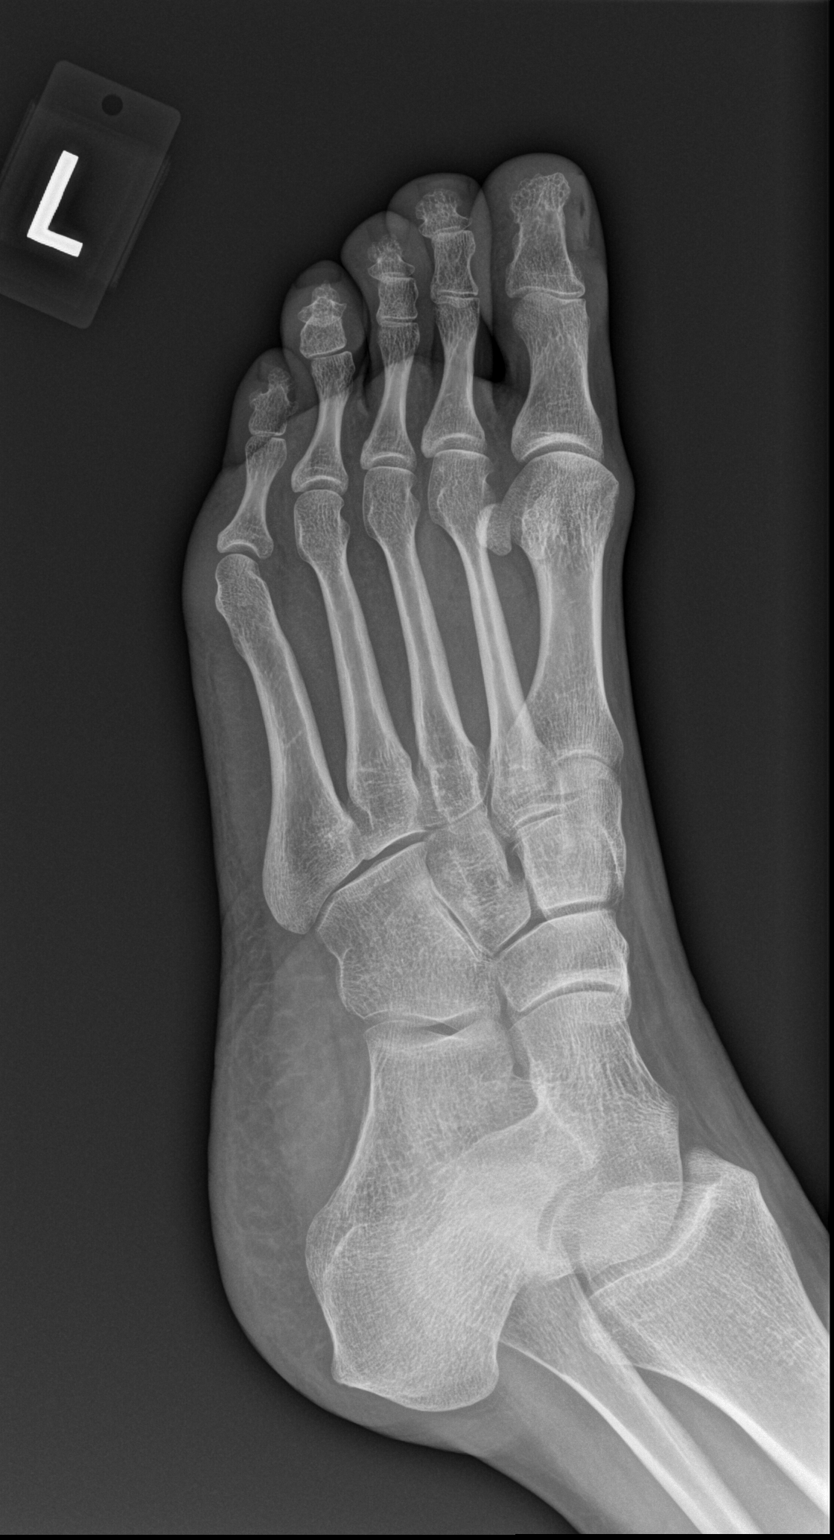

[x foot lat left]
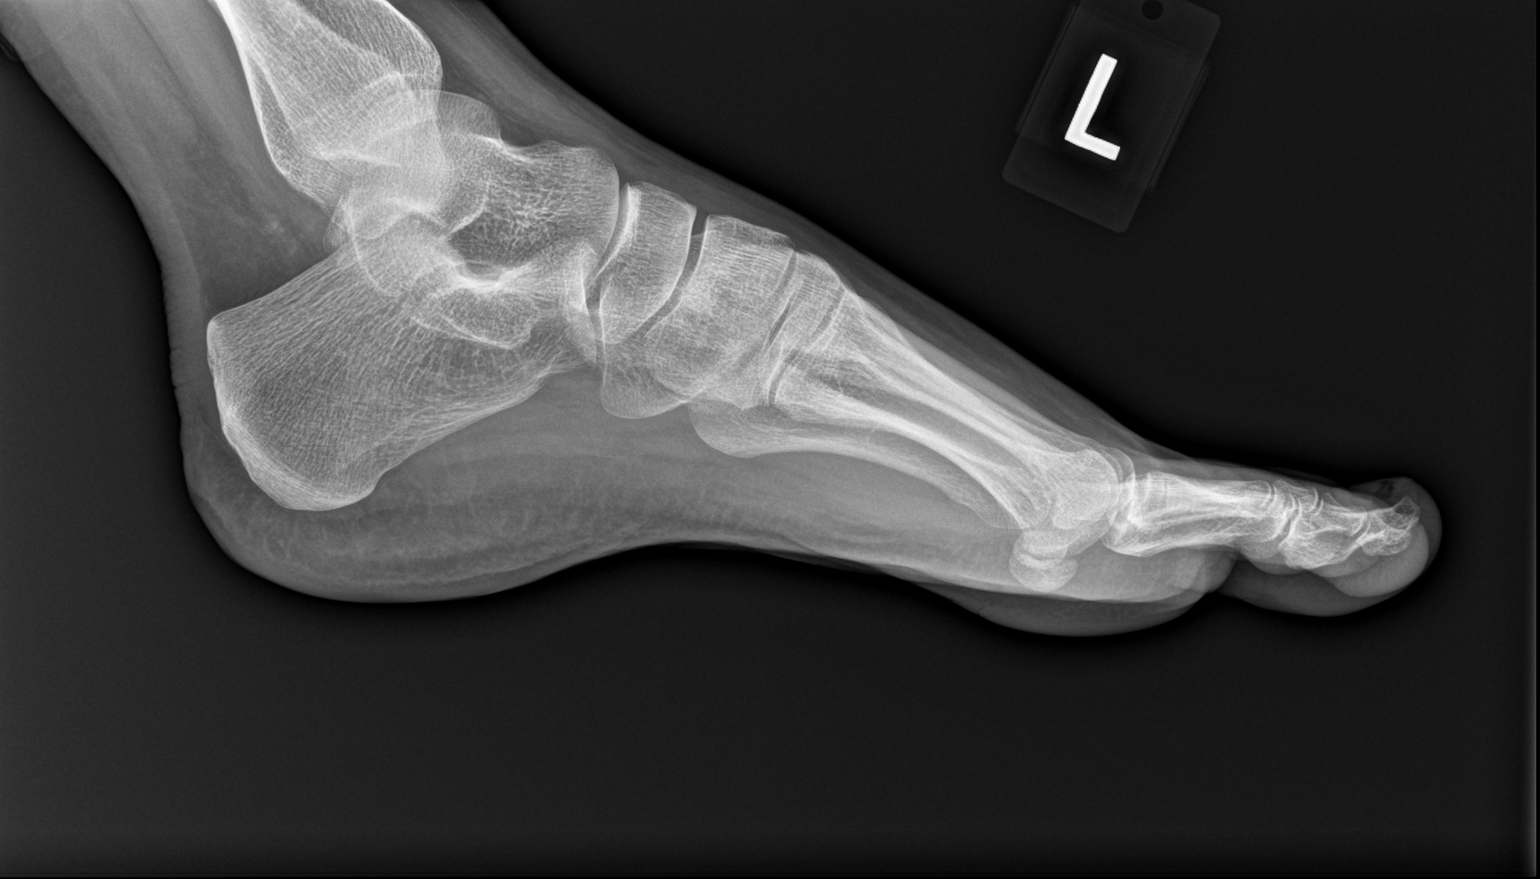

[3 of 3 positions shown; findings below may reference images not displayed]

FINDINGS: Suspect nondisplaced distal tuft fracture. No intra-articular
extension. There is air adjacent to the nail bed that may be
subungual.

No other fracture of the foot. The alignment and joint spaces are
preserved.
IMPRESSION: Suspect nondisplaced distal tuft fracture. Air adjacent to the nail
bed may be subungual.

## 2021-10-05 IMAGING — DX DG KNEE AP/LAT W/ SUNRISE*R*
3 series · 3 of 3 positions shown · non-contrast
Comparison: None.

CLINICAL DATA: Right anterior knee pain after fall.

EXAM:
RIGHT KNEE 3 VIEWS

[knee ap]
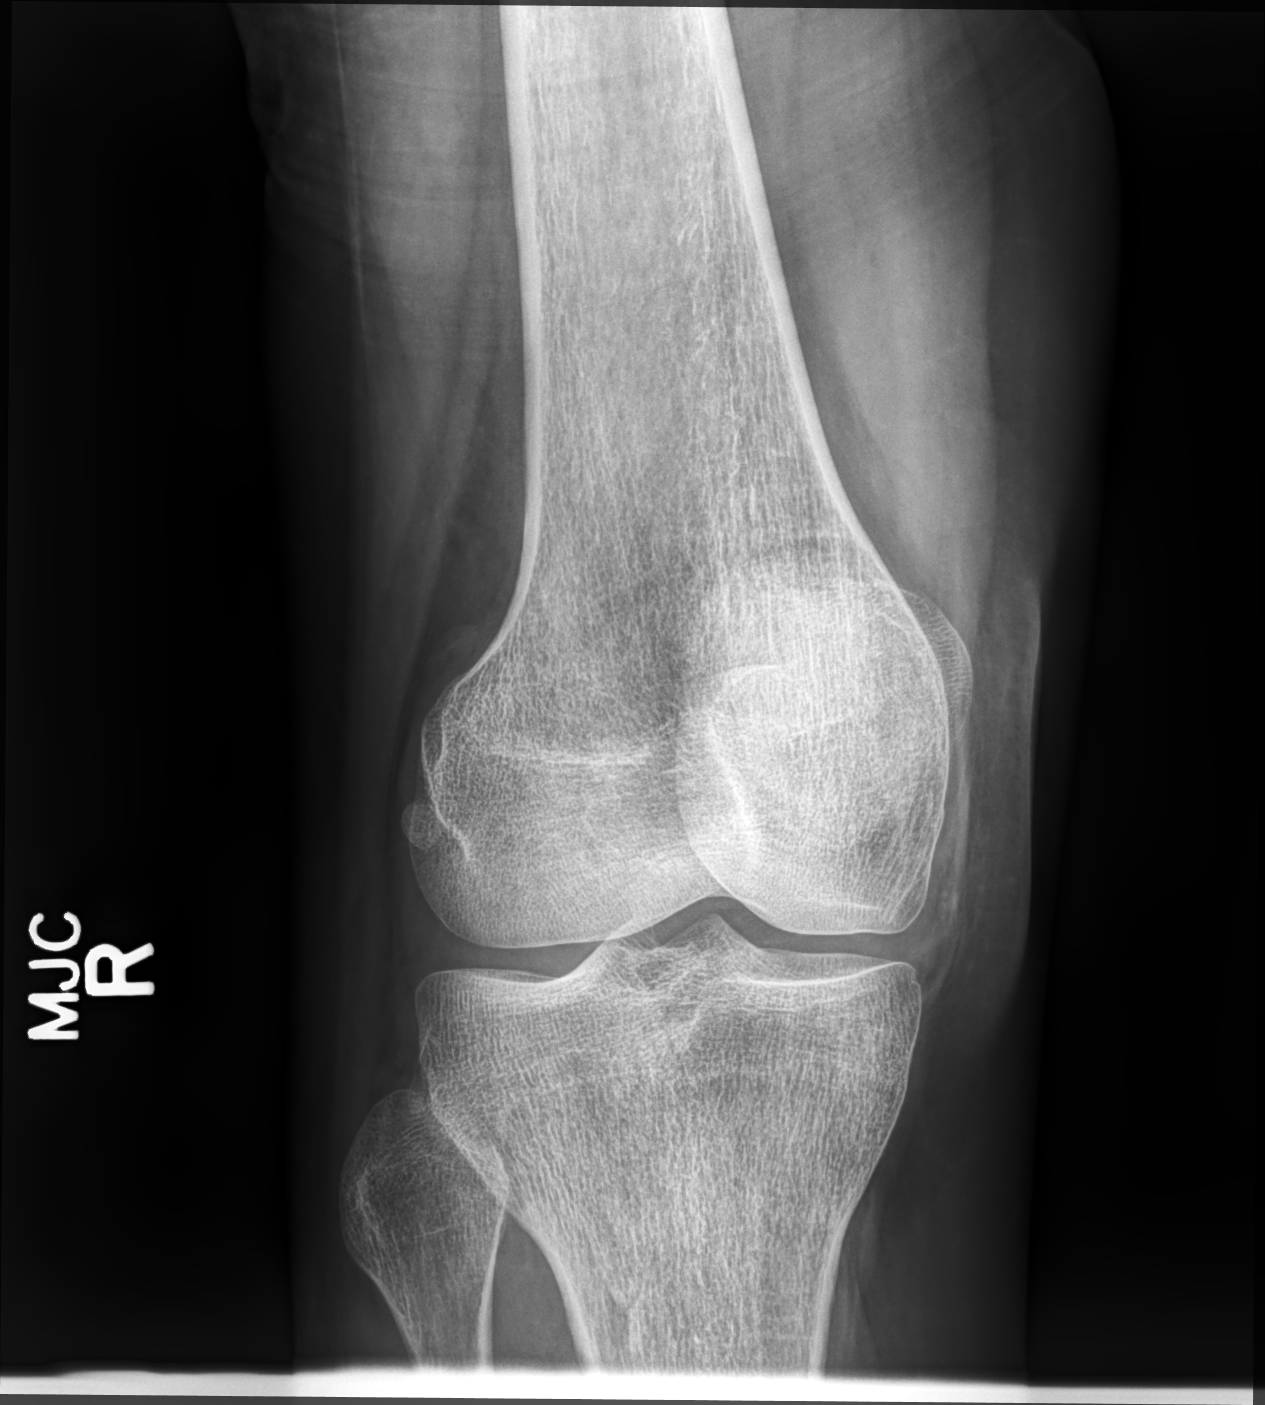

[knee lat]
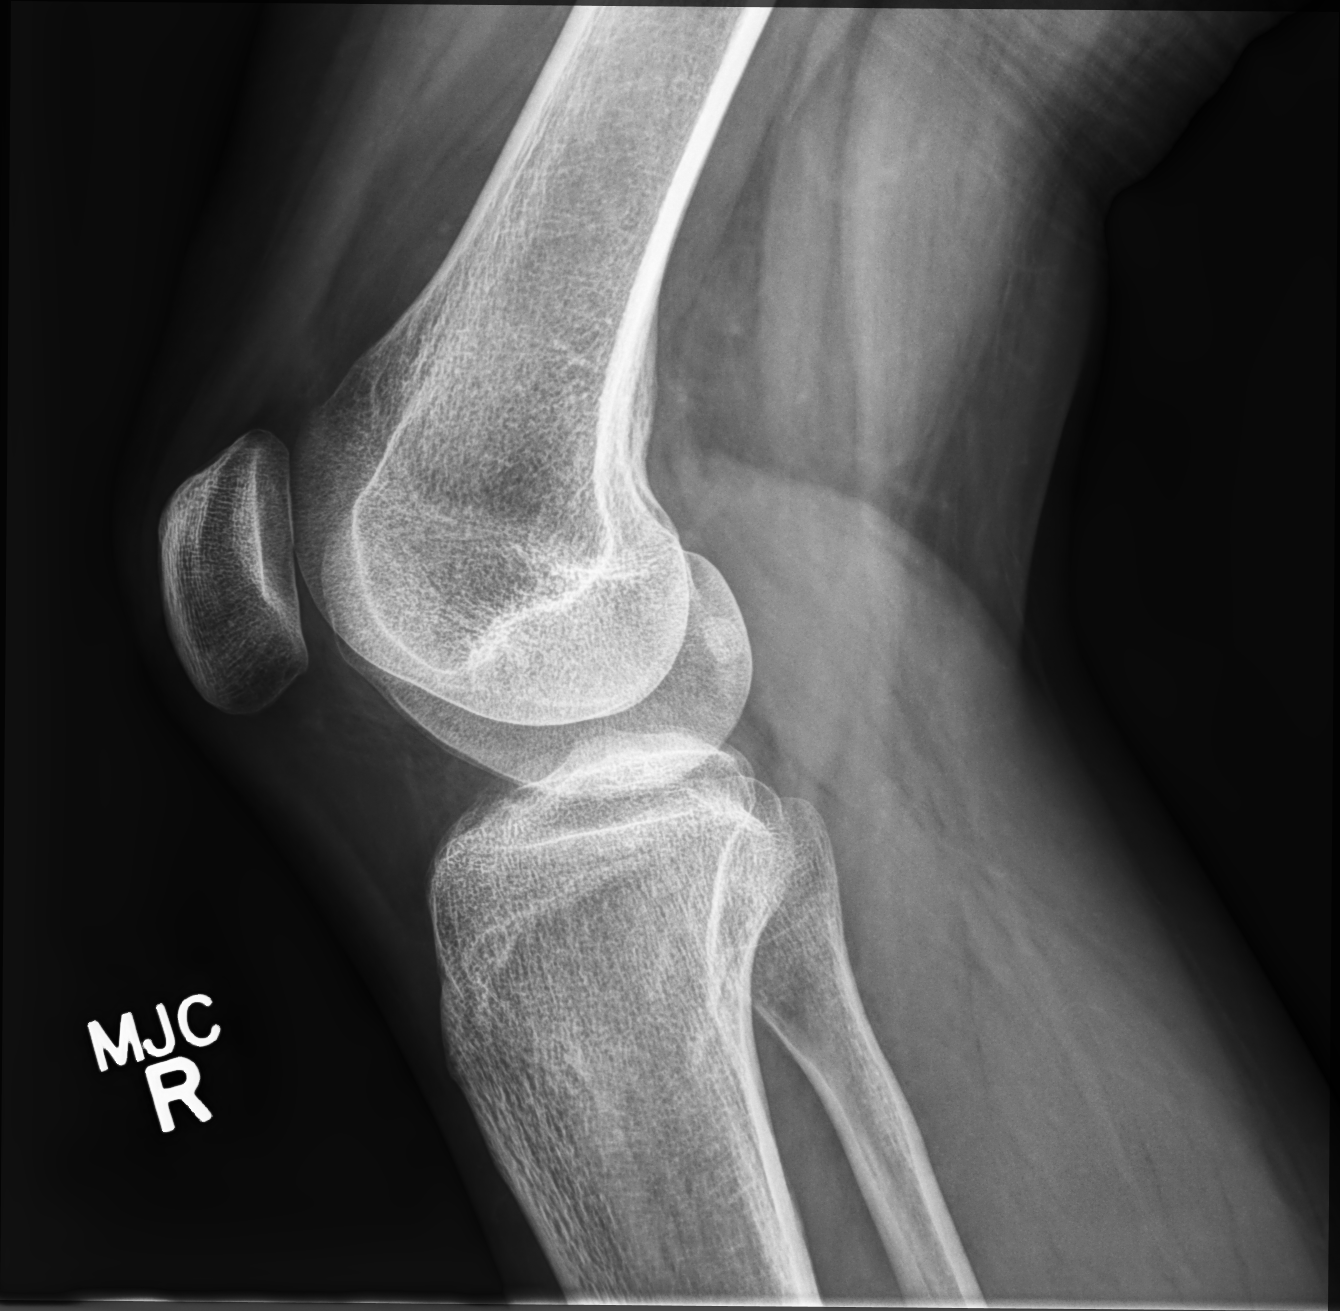

[patella [id]]
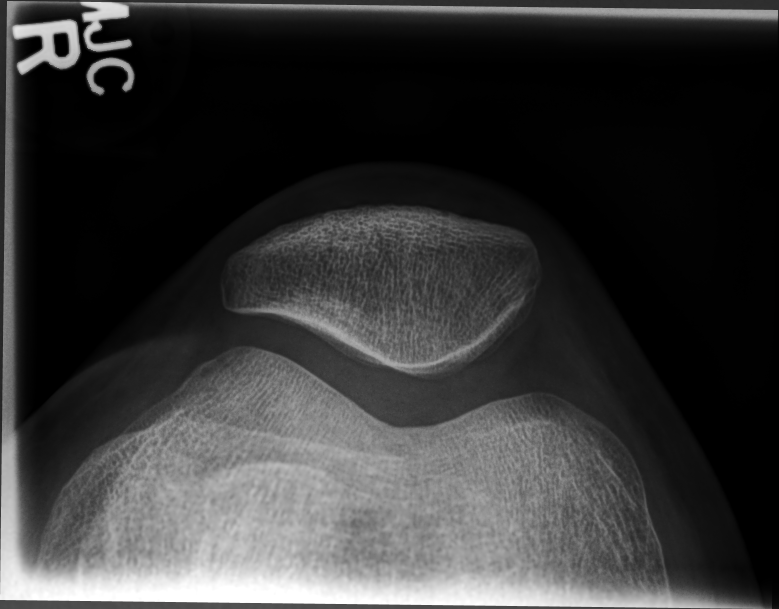

[3 of 3 positions shown; findings below may reference images not displayed]

FINDINGS: No evidence of fracture, dislocation, or joint effusion. No evidence
of arthropathy or other focal bone abnormality. Soft tissues are
unremarkable.
IMPRESSION: Negative.
